# Patient Record
Sex: Female | Born: 1985 | Race: White | Hispanic: No | Smoking: Former smoker
Health system: Southern US, Community
[De-identification: ages and names within clinical notes are randomized; demographics above are authoritative.]

## PROBLEM LIST (undated history)

## (undated) ENCOUNTER — Inpatient Hospital Stay (HOSPITAL_COMMUNITY): Payer: Self-pay

## (undated) ENCOUNTER — Emergency Department (HOSPITAL_COMMUNITY): Payer: 59

## (undated) DIAGNOSIS — D649 Anemia, unspecified: Secondary | ICD-10-CM

## (undated) DIAGNOSIS — F329 Major depressive disorder, single episode, unspecified: Secondary | ICD-10-CM

## (undated) DIAGNOSIS — F419 Anxiety disorder, unspecified: Secondary | ICD-10-CM

## (undated) DIAGNOSIS — R51 Headache: Secondary | ICD-10-CM

## (undated) DIAGNOSIS — R519 Headache, unspecified: Secondary | ICD-10-CM

## (undated) DIAGNOSIS — J329 Chronic sinusitis, unspecified: Secondary | ICD-10-CM

## (undated) DIAGNOSIS — J45909 Unspecified asthma, uncomplicated: Secondary | ICD-10-CM

## (undated) DIAGNOSIS — J40 Bronchitis, not specified as acute or chronic: Secondary | ICD-10-CM

## (undated) DIAGNOSIS — J189 Pneumonia, unspecified organism: Secondary | ICD-10-CM

## (undated) DIAGNOSIS — F32A Depression, unspecified: Secondary | ICD-10-CM

## (undated) HISTORY — DX: Bronchitis, not specified as acute or chronic: J40

## (undated) HISTORY — DX: Chronic sinusitis, unspecified: J32.9

## (undated) HISTORY — DX: Pneumonia, unspecified organism: J18.9

## (undated) HISTORY — PX: APPENDECTOMY: SHX54

---

## 2015-04-23 LAB — OB RESULTS CONSOLE HEPATITIS B SURFACE ANTIGEN: Hepatitis B Surface Ag: NEGATIVE

## 2015-04-23 LAB — OB RESULTS CONSOLE RPR: RPR: NONREACTIVE

## 2015-04-23 LAB — OB RESULTS CONSOLE RUBELLA ANTIBODY, IGM: Rubella: IMMUNE

## 2015-04-23 LAB — OB RESULTS CONSOLE ABO/RH: RH Type: POSITIVE

## 2015-04-23 LAB — OB RESULTS CONSOLE HIV ANTIBODY (ROUTINE TESTING): HIV: NONREACTIVE

## 2015-04-23 LAB — OB RESULTS CONSOLE GC/CHLAMYDIA
Chlamydia: NEGATIVE
Gonorrhea: NEGATIVE

## 2015-04-23 LAB — OB RESULTS CONSOLE ANTIBODY SCREEN: Antibody Screen: NEGATIVE

## 2015-09-30 ENCOUNTER — Inpatient Hospital Stay (HOSPITAL_COMMUNITY)
Admission: AD | Admit: 2015-09-30 | Discharge: 2015-10-01 | Disposition: A | Discharge status: Home / Self Care | Payer: 59 | Source: Ambulatory Visit | Attending: Obstetrics and Gynecology | Admitting: Obstetrics and Gynecology

## 2015-09-30 ENCOUNTER — Encounter (HOSPITAL_COMMUNITY): Payer: Self-pay

## 2015-09-30 DIAGNOSIS — O26893 Other specified pregnancy related conditions, third trimester: Secondary | ICD-10-CM | POA: Insufficient documentation

## 2015-09-30 DIAGNOSIS — Z3A30 30 weeks gestation of pregnancy: Secondary | ICD-10-CM | POA: Insufficient documentation

## 2015-09-30 DIAGNOSIS — Z0371 Encounter for suspected problem with amniotic cavity and membrane ruled out: Secondary | ICD-10-CM

## 2015-09-30 DIAGNOSIS — N898 Other specified noninflammatory disorders of vagina: Secondary | ICD-10-CM | POA: Diagnosis not present

## 2015-09-30 LAB — WET PREP, GENITAL
Sperm: NONE SEEN
Trich, Wet Prep: NONE SEEN
Yeast Wet Prep HPF POC: NONE SEEN

## 2015-09-30 NOTE — MAU Note (Signed)
Pt presents complaining of possible SROM at 2130. Had a gush of clear fluid. Reports good fetal movement. Denies bleeding. Denies pain. Some tightening occasionally.

## 2015-09-30 NOTE — MAU Provider Note (Signed)
History     CSN: [id number]  Arrival date and time: 09/30/15 2226   First Provider Initiated Contact with Patient 09/30/15 2306      Chief Complaint  Patient presents with  . Vaginal Discharge   Madison [name] is a 30 y.o. G2P1001 at 8572w1d who presents today after a gush of fluid at around 2130. She states that she stood up after using the bathroom, and a small gush of fluid leaked out. She has not needed to wear a pad. She denies any HX of preterm birth or complications with this pregnancy or her previous pregnancy. Next appointment on 10/03/15.   Vaginal Discharge  The patient's primary symptoms include vaginal discharge. This is a new problem. The current episode started today. The problem occurs intermittently. The problem has been unchanged. The patient is experiencing no pain. Pertinent negatives include no abdominal pain, chills, constipation, diarrhea, dysuria, fever, frequency, nausea, urgency or vomiting. The vaginal discharge was watery. There has been no bleeding. Nothing aggravates the symptoms. She has tried nothing for the symptoms.     No past medical history on file.  No past surgical history on file.  No family history on file.  Social History  Substance Use Topics  . Smoking status: Not on file  . Smokeless tobacco: Not on file  . Alcohol use Not on file    Allergies: Allergies not on file  No prescriptions prior to admission.    Review of Systems  Constitutional: Negative for chills and fever.  Gastrointestinal: Negative for abdominal pain, constipation, diarrhea, nausea and vomiting.  Genitourinary: Positive for vaginal discharge. Negative for dysuria, frequency and urgency.   Physical Exam   Blood pressure 119/74, pulse 104, temperature 98.3 F (36.8 C), temperature source Oral, resp. rate 18.  Physical Exam  Nursing note and vitals reviewed. Constitutional: She is oriented to person, place, and time. She appears well-developed and  well-nourished. No distress.  HENT:  Head: Normocephalic.  Cardiovascular: Normal rate.   Respiratory: Effort normal.  GI: Soft. There is no tenderness. There is no rebound.  Genitourinary:  Genitourinary Comments:  External: no lesion Vagina: small amount of white discharge. NO pooling Cervix: pink, smooth, no fluid seen with valsalva closed/thick  Uterus: AGA  Neurological: She is alert and oriented to person, place, and time.  Skin: Skin is warm and dry.  Psychiatric: She has a normal mood and affect.    FHT: 145, moderate with 15x15 accels, no decels Toco: no UCs   Results for orders placed or performed during the hospital encounter of 09/30/15 (from the past 24 hour(s))  Wet prep, genital     Status: Abnormal   Collection Time: 09/30/15 11:15 PM  Result Value Ref Range   Yeast Wet Prep HPF POC NONE SEEN NONE SEEN   Trich, Wet Prep NONE SEEN NONE SEEN   Clue Cells Wet Prep HPF POC PRESENT (A) NONE SEEN   WBC, Wet Prep HPF POC MANY (A) NONE SEEN   Sperm NONE SEEN     MAU Course  Procedures  MDM 2358: D/W Dr. Renaldo FiddlerAdkins, ok for DC home.   Assessment and Plan   1. Vaginal discharge in pregnancy in third trimester   2. Encounter for suspected premature rupture of membranes, with rupture of membranes not found   3. [redacted] weeks gestation of pregnancy    DC home Comfort measures reviewed  3rd Trimester precautions  PTL precautions  Fetal kick counts RX: none  Return to MAU as needed FU  with OB as planned  Follow-up Information    ADKINS,GRETCHEN, MD .   Specialty:  Obstetrics and Gynecology Contact information: [address], [address] [place] [id number] [phone]           Tawnya CrookHogan, Heather Donovan 09/30/2015, 11:09 PM

## 2015-10-01 DIAGNOSIS — O26893 Other specified pregnancy related conditions, third trimester: Secondary | ICD-10-CM | POA: Diagnosis not present

## 2015-10-01 DIAGNOSIS — N898 Other specified noninflammatory disorders of vagina: Secondary | ICD-10-CM | POA: Diagnosis not present

## 2015-10-01 NOTE — Discharge Instructions (Signed)

## 2015-11-03 ENCOUNTER — Inpatient Hospital Stay (HOSPITAL_COMMUNITY)
Admission: AD | Admit: 2015-11-03 | Discharge: 2015-11-03 | Disposition: A | Discharge status: Home / Self Care | Payer: 59 | Source: Ambulatory Visit | Attending: Obstetrics and Gynecology | Admitting: Obstetrics and Gynecology

## 2015-11-03 ENCOUNTER — Encounter (HOSPITAL_COMMUNITY): Payer: Self-pay

## 2015-11-03 DIAGNOSIS — O4703 False labor before 37 completed weeks of gestation, third trimester: Secondary | ICD-10-CM | POA: Diagnosis not present

## 2015-11-03 DIAGNOSIS — Z3A35 35 weeks gestation of pregnancy: Secondary | ICD-10-CM | POA: Insufficient documentation

## 2015-11-03 DIAGNOSIS — Z87891 Personal history of nicotine dependence: Secondary | ICD-10-CM | POA: Insufficient documentation

## 2015-11-03 HISTORY — DX: Anemia, unspecified: D64.9

## 2015-11-03 LAB — URINALYSIS, ROUTINE W REFLEX MICROSCOPIC
Bilirubin Urine: NEGATIVE
Glucose, UA: NEGATIVE mg/dL
Ketones, ur: NEGATIVE mg/dL
Leukocytes, UA: NEGATIVE
Nitrite: NEGATIVE
Protein, ur: 30 mg/dL — AB
Specific Gravity, Urine: 1.02 (ref 1.005–1.030)
pH: 6.5 (ref 5.0–8.0)

## 2015-11-03 LAB — WET PREP, GENITAL
Clue Cells Wet Prep HPF POC: NONE SEEN
Sperm: NONE SEEN
Trich, Wet Prep: NONE SEEN
Yeast Wet Prep HPF POC: NONE SEEN

## 2015-11-03 LAB — URINE MICROSCOPIC-ADD ON

## 2015-11-03 LAB — CBC WITH DIFFERENTIAL/PLATELET
Basophils Absolute: 0 10*3/uL (ref 0.0–0.1)
Basophils Relative: 0 %
Eosinophils Absolute: 0.1 10*3/uL (ref 0.0–0.7)
Eosinophils Relative: 1 %
HCT: 28.7 % — ABNORMAL LOW (ref 36.0–46.0)
Hemoglobin: 9.6 g/dL — ABNORMAL LOW (ref 12.0–15.0)
Lymphocytes Relative: 22 %
Lymphs Abs: 2 10*3/uL (ref 0.7–4.0)
MCH: 29.9 pg (ref 26.0–34.0)
MCHC: 33.4 g/dL (ref 30.0–36.0)
MCV: 89.4 fL (ref 78.0–100.0)
Monocytes Absolute: 0.5 10*3/uL (ref 0.1–1.0)
Monocytes Relative: 5 %
Neutro Abs: 6.4 10*3/uL (ref 1.7–7.7)
Neutrophils Relative %: 72 %
Platelets: 220 10*3/uL (ref 150–400)
RBC: 3.21 MIL/uL — ABNORMAL LOW (ref 3.87–5.11)
RDW: 14 % (ref 11.5–15.5)
WBC: 9 10*3/uL (ref 4.0–10.5)

## 2015-11-03 LAB — GROUP B STREP BY PCR: Group B strep by PCR: NEGATIVE

## 2015-11-03 MED ORDER — ONDANSETRON 4 MG PO TBDP: 4.0000 mg | ORAL_TABLET | Freq: Four times a day (QID) | ORAL | 0 refills | Status: DC | PRN

## 2015-11-03 MED ORDER — BETAMETHASONE SOD PHOS & ACET 6 (3-3) MG/ML IJ SUSP
12.0000 mg | INTRAMUSCULAR | Status: AC
  Administered 2015-11-03: 12 mg via INTRAMUSCULAR
  Filled 2015-11-03: qty 2

## 2015-11-03 MED ORDER — ONDANSETRON HCL 4 MG/2ML IJ SOLN
4.0000 mg | Freq: Four times a day (QID) | INTRAMUSCULAR | Status: DC | PRN
Start: 2015-11-03 — End: 2015-11-03
  Administered 2015-11-03: 4 mg via INTRAVENOUS
  Filled 2015-11-03: qty 2

## 2015-11-03 MED ORDER — FENTANYL CITRATE (PF) 100 MCG/2ML IJ SOLN
50.0000 ug | INTRAMUSCULAR | Status: DC | PRN
  Administered 2015-11-03: 100 ug via INTRAVENOUS
  Administered 2015-11-03: 50 ug via INTRAVENOUS
  Filled 2015-11-03 (×2): qty 2

## 2015-11-03 MED ORDER — OXYCODONE-ACETAMINOPHEN 5-325 MG PO TABS: 1.0000 | ORAL_TABLET | Freq: Four times a day (QID) | ORAL | 0 refills | Status: DC | PRN

## 2015-11-03 MED ORDER — PROMETHAZINE HCL 25 MG PO TABS: 25.0000 mg | ORAL_TABLET | Freq: Four times a day (QID) | ORAL | 2 refills | Status: DC | PRN

## 2015-11-03 MED ORDER — LACTATED RINGERS IV BOLUS (SEPSIS)
1000.0000 mL | Freq: Once | INTRAVENOUS | Status: AC
  Administered 2015-11-03: 1000 mL via INTRAVENOUS

## 2015-11-03 NOTE — Discharge Instructions (Signed)
Preterm Labor Information °Preterm labor is when labor starts at less than 37 weeks of pregnancy. The normal length of a pregnancy is 39 to 41 weeks. °CAUSES °Often, there is no identifiable underlying cause as to why a woman goes into preterm labor. One of the most common known causes of preterm labor is infection. Infections of the uterus, cervix, vagina, amniotic sac, bladder, kidney, or even the lungs (pneumonia) can cause labor to start. Other suspected causes of preterm labor include:  °· Urogenital infections, such as yeast infections and bacterial vaginosis.   °· Uterine abnormalities (uterine shape, uterine septum, fibroids, or bleeding from the placenta).   °· A cervix that has been operated on (it may fail to stay closed).   °· Malformations in the fetus.   °· Multiple gestations (twins, triplets, and so on).   °· Breakage of the amniotic sac.   °RISK FACTORS °· Having a previous history of preterm labor.   °· Having premature rupture of membranes (PROM).   °· Having a placenta that covers the opening of the cervix (placenta previa).   °· Having a placenta that separates from the uterus (placental abruption).   °· Having a cervix that is too weak to hold the fetus in the uterus (incompetent cervix).   °· Having too much fluid in the amniotic sac (polyhydramnios).   °· Taking illegal drugs or smoking while pregnant.   °· Not gaining enough weight while pregnant.   °· Being younger than 18 and older than 31 years old.   °· Having a low socioeconomic status.   °· Being African American. °SYMPTOMS °Signs and symptoms of preterm labor include:  °· Menstrual-like cramps, abdominal pain, or back pain. °· Uterine contractions that are regular, as frequent as six in an hour, regardless of their intensity (may be mild or painful). °· Contractions that start on the top of the uterus and spread down to the lower abdomen and back.   °· A sense of increased pelvic pressure.   °· A watery or bloody mucus discharge that  comes from the vagina.   °TREATMENT °Depending on the length of the pregnancy and other circumstances, your health care provider may suggest bed rest. If necessary, there are medicines that can be given to stop contractions and to mature the fetal lungs. If labor happens before 34 weeks of pregnancy, a prolonged hospital stay may be recommended. Treatment depends on the condition of both you and the fetus.  °WHAT SHOULD YOU DO IF YOU THINK YOU ARE IN PRETERM LABOR? °Call your health care provider right away. You will need to go to the hospital to get checked immediately. °HOW CAN YOU PREVENT PRETERM LABOR IN FUTURE PREGNANCIES? °You should:  °· Stop smoking if you smoke.  °· Maintain healthy weight gain and avoid chemicals and drugs that are not necessary. °· Be watchful for any type of infection. °· Inform your health care provider if you have a known history of preterm labor. °  °This information is not intended to replace advice given to you by your health care provider. Make sure you discuss any questions you have with your health care provider. °  °Document Released: 04/24/2003 Document Revised: 10/04/2012 Document Reviewed: 03/06/2012 °Elsevier Interactive Patient Education ©2016 [employer] ° ° °Braxton Hicks Contractions °Contractions of the uterus can occur throughout pregnancy. Contractions are not always a sign that you are in labor.  °WHAT ARE BRAXTON HICKS CONTRACTIONS?  °Contractions that occur before labor are called Braxton Hicks contractions, or false labor. Toward the end of pregnancy (32-34 weeks), these contractions can develop more often and may become   more forceful. This is not true labor because these contractions do not result in opening (dilatation) and thinning of the cervix. They are sometimes difficult to tell apart from true labor because these contractions can be forceful and people have different pain tolerances. You should not feel embarrassed if you go to the hospital with false  labor. Sometimes, the only way to tell if you are in true labor is for your health care provider to look for changes in the cervix. °If there are no prenatal problems or other health problems associated with the pregnancy, it is completely safe to be sent home with false labor and await the onset of true labor. °HOW CAN YOU TELL THE DIFFERENCE BETWEEN TRUE AND FALSE LABOR? °False Labor °· The contractions of false labor are usually shorter and not as hard as those of true labor.   °· The contractions are usually irregular.   °· The contractions are often felt in the front of the lower abdomen and in the groin.   °· The contractions may go away when you walk around or change positions while lying down.   °· The contractions get weaker and are shorter lasting as time goes on.   °· The contractions do not usually become progressively stronger, regular, and closer together as with true labor.   °True Labor °· Contractions in true labor last 30-70 seconds, become very regular, usually become more intense, and increase in frequency.   °· The contractions do not go away with walking.   °· The discomfort is usually felt in the top of the uterus and spreads to the lower abdomen and low back.   °· True labor can be determined by your health care provider with an exam. This will show that the cervix is dilating and getting thinner.   °WHAT TO REMEMBER °· Keep up with your usual exercises and follow other instructions given by your health care provider.   °· Take medicines as directed by your health care provider.   °· Keep your regular prenatal appointments.   °· Eat and drink lightly if you think you are going into labor.   °· If Braxton Hicks contractions are making you uncomfortable:   °¨ Change your position from lying down or resting to walking, or from walking to resting.   °¨ Sit and rest in a tub of warm water.   °¨ Drink 2-3 glasses of water. Dehydration may cause these contractions.   °¨ Do slow and deep breathing  several times an hour.   °WHEN SHOULD I SEEK IMMEDIATE MEDICAL CARE? °Seek immediate medical care if: °· Your contractions become stronger, more regular, and closer together.   °· You have fluid leaking or gushing from your vagina.   °· You have a fever.   °· You pass blood-tinged mucus.   °· You have vaginal bleeding.   °· You have continuous abdominal pain.   °· You have low back pain that you never had before.   °· You feel your baby's head pushing down and causing pelvic pressure.   °· Your baby is not moving as much as it used to.   °  °This information is not intended to replace advice given to you by your health care provider. Make sure you discuss any questions you have with your health care provider. °  °Document Released: 02/01/2005 Document Revised: 02/06/2013 Document Reviewed: 11/13/2012 °Elsevier Interactive Patient Education ©2016 Elsevier Inc. ° °

## 2015-11-03 NOTE — MAU Provider Note (Signed)
History     CSN: [id number]  Arrival date and time: 11/03/15 4098   First Provider Initiated Contact with Patient 11/03/15 1018      Chief Complaint  Patient presents with  . Contractions   HPI 30 y.o. G2P1001 at [redacted]w[redacted]d followed at [facility], presents today with report of frequent painful contractions since this morning.  Denies LOF, VB.  Good FM currently. History remarkable for previous term SVD complicated by shoulder dystocia and 4th degree laceration, patient is scheduled for cesarean section at 39 weeks. Today, she says she is willing to undergo vaginal delivery if preterm labor progresses as this baby is small, and was measuring two weeks behind at her prenatal visits.    Obstetric History   G2   P1   T1   P0   A0   L1    SAB0   TAB0   Ectopic0   Multiple0   Live Births1     # Outcome Date GA Lbr Len/2nd Weight Sex Delivery Anes PTL Lv  2 Current           1 Term      Vag-Spont   LIV      Past Medical History:  Diagnosis Date  . Anemia     History reviewed. No pertinent surgical history.  History reviewed. No pertinent family history.  Social History  Substance Use Topics  . Smoking status: Former Smoker    Types: Cigarettes    Quit date: 04/08/2015  . Smokeless tobacco: Never Used  . Alcohol use No    Allergies:  Allergies  Allergen Reactions  . Codeine Nausea And Vomiting  . Hydrocodone Nausea And Vomiting    Prescriptions Prior to Admission  Medication Sig Dispense Refill Last Dose  . docusate sodium (COLACE) 100 MG capsule Take 100 mg by mouth 2 (two) times daily.   11/02/2015 at Unknown time  . famotidine (PEPCID AC) 10 MG chewable tablet Chew 10 mg by mouth 2 (two) times daily.   11/02/2015 at Unknown time  . Prenatal Vit-Fe Fumarate-FA (PRENATAL MULTIVITAMIN) TABS tablet Take 1 tablet by mouth daily at 12 noon.   11/02/2015 at Unknown time    Review of Systems  Constitutional: Negative for chills and fever.  Gastrointestinal: Negative  for abdominal pain, diarrhea, nausea and vomiting.  Genitourinary: Negative for dysuria, frequency, hematuria and urgency.  Neurological: Negative for headaches.   Physical Exam   Blood pressure 121/78, pulse 92, temperature 98.8 F (37.1 C), resp. rate 20. Category 1 FHR tracing Contractions q2-3 minutes  Physical Exam  Constitutional: She is oriented to person, place, and time. She appears well-developed. She appears distressed.  Distressed due to contraction pain  HENT:  Head: Normocephalic and atraumatic.  Neck: Normal range of motion. Neck supple.  Cardiovascular: Normal rate and regular rhythm.   Respiratory: Effort normal and breath sounds normal.  GI: Soft. She exhibits no distension. There is no tenderness.  Genitourinary: Vagina normal. No vaginal discharge found.  Musculoskeletal: Normal range of motion.  Neurological: She is alert and oriented to person, place, and time.  Skin: Skin is warm and dry.  Cervix: Dilation: 1 Effacement (%): 50 Cervical Position: Posterior Station: -3 Presentation: Vertex Exam by:: Dr. Macon Large  MAU Course  Procedures  MDM 1015: Exam done, cultures including rapid GBS done. Other labs ordered. Fentanyl ordered for pain. LR 1L bolus ordered. Betamethasone ordered given preterm status.  1030: Patient had episodes of emesis after IV placement (had multiple attempts).  Zofran ordered.  1145: Unchanged cervical exam. Patient still in a lot of pain.  Second dose of Fentanyl 100 mg ordered. Called Dr. Rana Snare, the Attending on call and discussed patient with him. He agrees with plan for further observation for about one hour and pain medication. If no change, will send home with precaution sand follow up in office tomorrow for evaluation and possible second betamethasone. 1343 Unchanged exam. Will discharge to home. PTL and FM precautions advised.   Results for orders placed or performed during the hospital encounter of 11/03/15 (from the past 24  hour(s))  Group B strep by PCR     Status: None   Collection Time: 11/03/15 10:15 AM  Result Value Ref Range   Group B strep by PCR NEGATIVE NEGATIVE  Wet prep, genital     Status: Abnormal   Collection Time: 11/03/15 10:15 AM  Result Value Ref Range   Yeast Wet Prep HPF POC NONE SEEN NONE SEEN   Trich, Wet Prep NONE SEEN NONE SEEN   Clue Cells Wet Prep HPF POC NONE SEEN NONE SEEN   WBC, Wet Prep HPF POC FEW (A) NONE SEEN   Sperm NONE SEEN   Urinalysis, Routine w reflex microscopic (not at [facility])     Status: Abnormal   Collection Time: 11/03/15 10:17 AM  Result Value Ref Range   Color, Urine YELLOW YELLOW   APPearance CLOUDY (A) CLEAR   Specific Gravity, Urine 1.020 1.005 - 1.030   pH 6.5 5.0 - 8.0   Glucose, UA NEGATIVE NEGATIVE mg/dL   Hgb urine dipstick LARGE (A) NEGATIVE   Bilirubin Urine NEGATIVE NEGATIVE   Ketones, ur NEGATIVE NEGATIVE mg/dL   Protein, ur 30 (A) NEGATIVE mg/dL   Nitrite NEGATIVE NEGATIVE   Leukocytes, UA NEGATIVE NEGATIVE  Urine microscopic-add on     Status: Abnormal   Collection Time: 11/03/15 10:17 AM  Result Value Ref Range   Squamous Epithelial / LPF 0-5 (A) NONE SEEN   WBC, UA 0-5 0 - 5 WBC/hpf   RBC / HPF TOO NUMEROUS TO COUNT 0 - 5 RBC/hpf   Bacteria, UA FEW (A) NONE SEEN   Urine-Other MUCOUS PRESENT   CBC with Differential/Platelet     Status: Abnormal   Collection Time: 11/03/15 10:40 AM  Result Value Ref Range   WBC 9.0 4.0 - 10.5 K/uL   RBC 3.21 (L) 3.87 - 5.11 MIL/uL   Hemoglobin 9.6 (L) 12.0 - 15.0 g/dL   HCT 24.4 (L) 01.0 - 27.2 %   MCV 89.4 78.0 - 100.0 fL   MCH 29.9 26.0 - 34.0 pg   MCHC 33.4 30.0 - 36.0 g/dL   RDW 53.6 64.4 - 03.4 %   Platelets 220 150 - 400 K/uL   Neutrophils Relative % 72 %   Neutro Abs 6.4 1.7 - 7.7 K/uL   Lymphocytes Relative 22 %   Lymphs Abs 2.0 0.7 - 4.0 K/uL   Monocytes Relative 5 %   Monocytes Absolute 0.5 0.1 - 1.0 K/uL   Eosinophils Relative 1 %   Eosinophils Absolute 0.1 0.0 - 0.7 K/uL    Basophils Relative 0 %   Basophils Absolute 0.0 0.0 - 0.1 K/uL    Assessment and Plan  Threatened preterm labor. No cervical change in over 3 hours of observation.  She received one dose of betamethasone, will be reevaluate in office tomorrow and may receive 2nd dose then. PTL and FM precautions reviewed with patient. Percocet given prn pain. Phenergan and Zofran  given as needed for nausea. Discharged to home in stable condition.  Tereso NewcomerANYANWU,UGONNA A, MD 11/03/2015, 1:44 PM

## 2015-11-03 NOTE — MAU Note (Signed)
Pain/contractions started around 0815. Has been getting worse since and closer together

## 2015-11-04 ENCOUNTER — Inpatient Hospital Stay (HOSPITAL_COMMUNITY)
Admission: AD | Admit: 2015-11-04 | Discharge: 2015-11-04 | Disposition: A | Discharge status: Home / Self Care | Payer: 59 | Source: Ambulatory Visit | Attending: Obstetrics and Gynecology | Admitting: Obstetrics and Gynecology

## 2015-11-04 DIAGNOSIS — Z3A35 35 weeks gestation of pregnancy: Secondary | ICD-10-CM | POA: Diagnosis not present

## 2015-11-04 LAB — GC/CHLAMYDIA PROBE AMP (~~LOC~~) NOT AT ARMC
Chlamydia: NEGATIVE
Neisseria Gonorrhea: NEGATIVE

## 2015-11-04 LAB — RPR: RPR Ser Ql: NONREACTIVE

## 2015-11-04 MED ORDER — BETAMETHASONE SOD PHOS & ACET 6 (3-3) MG/ML IJ SUSP
12.0000 mg | Freq: Once | INTRAMUSCULAR | Status: AC
  Administered 2015-11-04: 12 mg via INTRAMUSCULAR
  Filled 2015-11-04: qty 2

## 2015-11-25 ENCOUNTER — Telehealth (HOSPITAL_COMMUNITY): Payer: Self-pay | Admitting: *Deleted

## 2015-11-25 ENCOUNTER — Encounter (HOSPITAL_COMMUNITY): Payer: Self-pay

## 2015-11-25 LAB — OB RESULTS CONSOLE GBS: GBS: NEGATIVE

## 2015-11-25 NOTE — Telephone Encounter (Signed)
Preadmission screen  

## 2015-11-26 ENCOUNTER — Encounter (HOSPITAL_COMMUNITY): Payer: Self-pay

## 2015-12-02 ENCOUNTER — Encounter (HOSPITAL_COMMUNITY)
Admission: RE | Admit: 2015-12-02 | Discharge: 2015-12-02 | Disposition: A | Discharge status: Home / Self Care | Payer: 59 | Source: Ambulatory Visit | Attending: Obstetrics and Gynecology | Admitting: Obstetrics and Gynecology

## 2015-12-02 ENCOUNTER — Encounter (HOSPITAL_COMMUNITY): Payer: Self-pay | Admitting: Anesthesiology

## 2015-12-02 HISTORY — DX: Anxiety disorder, unspecified: F41.9

## 2015-12-02 HISTORY — DX: Headache, unspecified: R51.9

## 2015-12-02 HISTORY — DX: Depression, unspecified: F32.A

## 2015-12-02 HISTORY — DX: Headache: R51

## 2015-12-02 HISTORY — DX: Major depressive disorder, single episode, unspecified: F32.9

## 2015-12-02 HISTORY — DX: Unspecified asthma, uncomplicated: J45.909

## 2015-12-02 LAB — CBC
HCT: 29.6 % — ABNORMAL LOW (ref 36.0–46.0)
Hemoglobin: 9.7 g/dL — ABNORMAL LOW (ref 12.0–15.0)
MCH: 30.1 pg (ref 26.0–34.0)
MCHC: 32.8 g/dL (ref 30.0–36.0)
MCV: 91.9 fL (ref 78.0–100.0)
Platelets: 258 10*3/uL (ref 150–400)
RBC: 3.22 MIL/uL — ABNORMAL LOW (ref 3.87–5.11)
RDW: 15.1 % (ref 11.5–15.5)
WBC: 8.6 10*3/uL (ref 4.0–10.5)

## 2015-12-02 LAB — TYPE AND SCREEN
ABO/RH(D): O POS
Antibody Screen: NEGATIVE

## 2015-12-02 LAB — ABO/RH: ABO/RH(D): O POS

## 2015-12-02 MED ORDER — DEXTROSE 5 % IV SOLN
2.0000 g | INTRAVENOUS | Status: AC
  Administered 2015-12-03: 2 g via INTRAVENOUS
  Filled 2015-12-02: qty 2

## 2015-12-02 NOTE — Anesthesia Preprocedure Evaluation (Addendum)
Anesthesia Evaluation  Patient identified by MRN, date of birth, ID band Patient awake    Reviewed: Allergy & Precautions, NPO status , Patient's Chart, lab work & pertinent test results  Airway Mallampati: I       Dental no notable dental hx.    Pulmonary former smoker,    Pulmonary exam normal        Cardiovascular negative cardio ROS Normal cardiovascular exam     Neuro/Psych    GI/Hepatic negative GI ROS, Neg liver ROS,   Endo/Other  negative endocrine ROS  Renal/GU negative Renal ROS  negative genitourinary   Musculoskeletal negative musculoskeletal ROS (+)   Abdominal Normal abdominal exam  (+)   Peds negative pediatric ROS (+)  Hematology   Anesthesia Other Findings   Reproductive/Obstetrics negative OB ROS                            Anesthesia Physical Anesthesia Plan  ASA: II  Anesthesia Plan: Spinal   Post-op Pain Management:    Induction:   Airway Management Planned:   Additional Equipment:   Intra-op Plan:   Post-operative Plan:   Informed Consent: I have reviewed the patients History and Physical, chart, labs and discussed the procedure including the risks, benefits and alternatives for the proposed anesthesia with the patient or authorized representative who has indicated his/her understanding and acceptance.     Plan Discussed with: CRNA and Surgeon  Anesthesia Plan Comments:        Anesthesia Quick Evaluation

## 2015-12-02 NOTE — Patient Instructions (Signed)
20 Georgette ShellRebecca Partin  12/02/2015   Your procedure is scheduled on:  12/03/2015  [facility] 0[facility] AM.  Pick up the phone at the desk and dial [phone].   Call this number if you have problems the morning of surgery: [phone]   Remember:   Do not eat food:After Midnight.  Do not drink clear liquids: After Midnight.  Take these medicines the morning of surgery with A SIP OF WATER: zantac   Do not wear jewelry, make-up or nail polish.  Do not wear lotions, powders, or perfumes. Do not wear deodorant.  Do not shave 48 hours prior to surgery.  Do not bring valuables to the hospital.  [facility] is not   responsible for any belongings or valuables brought to the hospital.  Contacts, dentures or bridgework may not be worn into surgery.  Leave suitcase in the car. After surgery it may be brought to your room.  For patients admitted to the hospital, checkout time is 11:00 AM the day of              discharge.   Patients discharged the day of surgery will not be allowed to drive             home.  Name and phone number of your driver: na  Special Instructions:   N/A   Please read over the following fact sheets that you were given:   Surgical Site Infection Prevention

## 2015-12-03 ENCOUNTER — Inpatient Hospital Stay (HOSPITAL_COMMUNITY): Payer: 59 | Admitting: Anesthesiology

## 2015-12-03 ENCOUNTER — Encounter (HOSPITAL_COMMUNITY): Payer: Self-pay

## 2015-12-03 ENCOUNTER — Inpatient Hospital Stay (HOSPITAL_COMMUNITY)
Admission: RE | Admit: 2015-12-03 | Discharge: 2015-12-07 | DRG: 765 | Disposition: A | Discharge status: Home / Self Care | Payer: 59 | Source: Ambulatory Visit | Attending: Obstetrics and Gynecology | Admitting: Obstetrics and Gynecology

## 2015-12-03 ENCOUNTER — Encounter (HOSPITAL_COMMUNITY)
Admission: RE | Disposition: A | Discharge status: Home / Self Care | Payer: Self-pay | Source: Ambulatory Visit | Attending: Obstetrics and Gynecology

## 2015-12-03 DIAGNOSIS — K567 Ileus, unspecified: Secondary | ICD-10-CM | POA: Diagnosis present

## 2015-12-03 DIAGNOSIS — Z8249 Family history of ischemic heart disease and other diseases of the circulatory system: Secondary | ICD-10-CM

## 2015-12-03 DIAGNOSIS — Z833 Family history of diabetes mellitus: Secondary | ICD-10-CM

## 2015-12-03 DIAGNOSIS — Z3A39 39 weeks gestation of pregnancy: Secondary | ICD-10-CM

## 2015-12-03 DIAGNOSIS — Z3490 Encounter for supervision of normal pregnancy, unspecified, unspecified trimester: Secondary | ICD-10-CM

## 2015-12-03 DIAGNOSIS — K9189 Other postprocedural complications and disorders of digestive system: Secondary | ICD-10-CM | POA: Diagnosis present

## 2015-12-03 DIAGNOSIS — Z87891 Personal history of nicotine dependence: Secondary | ICD-10-CM | POA: Diagnosis not present

## 2015-12-03 DIAGNOSIS — O26893 Other specified pregnancy related conditions, third trimester: Principal | ICD-10-CM | POA: Diagnosis present

## 2015-12-03 DIAGNOSIS — Z823 Family history of stroke: Secondary | ICD-10-CM | POA: Diagnosis not present

## 2015-12-03 LAB — RPR: RPR Ser Ql: NONREACTIVE

## 2015-12-03 SURGERY — Surgical Case
Anesthesia: Spinal

## 2015-12-03 MED ORDER — KETOROLAC TROMETHAMINE 30 MG/ML IJ SOLN
INTRAMUSCULAR | Status: AC
  Administered 2015-12-03: 30 mg via INTRAMUSCULAR
  Filled 2015-12-03: qty 1

## 2015-12-03 MED ORDER — SCOPOLAMINE 1 MG/3DAYS TD PT72
1.0000 | MEDICATED_PATCH | Freq: Once | TRANSDERMAL | Status: DC
  Administered 2015-12-03: 1.5 mg via TRANSDERMAL

## 2015-12-03 MED ORDER — SIMETHICONE 80 MG PO CHEW
80.0000 mg | CHEWABLE_TABLET | ORAL | Status: DC
  Administered 2015-12-03 – 2015-12-06 (×4): 80 mg via ORAL
  Filled 2015-12-03 (×4): qty 1

## 2015-12-03 MED ORDER — PHENYLEPHRINE 8 MG IN D5W 100 ML (0.08MG/ML) PREMIX OPTIME
INJECTION | INTRAVENOUS | Status: AC
  Filled 2015-12-03: qty 100

## 2015-12-03 MED ORDER — EPHEDRINE SULFATE 50 MG/ML IJ SOLN
INTRAMUSCULAR | Status: DC | PRN
  Administered 2015-12-03: 10 mg via INTRAVENOUS

## 2015-12-03 MED ORDER — ONDANSETRON HCL 4 MG/2ML IJ SOLN: 4.0000 mg | Freq: Three times a day (TID) | INTRAMUSCULAR | Status: DC | PRN

## 2015-12-03 MED ORDER — FENTANYL CITRATE (PF) 100 MCG/2ML IJ SOLN
INTRAMUSCULAR | Status: AC
  Filled 2015-12-03: qty 2

## 2015-12-03 MED ORDER — SODIUM CHLORIDE 0.9 % IR SOLN
Status: DC | PRN
  Administered 2015-12-03: 1000 mL

## 2015-12-03 MED ORDER — MORPHINE SULFATE-NACL 0.5-0.9 MG/ML-% IV SOSY
PREFILLED_SYRINGE | INTRAVENOUS | Status: AC
  Filled 2015-12-03: qty 1

## 2015-12-03 MED ORDER — IBUPROFEN 600 MG PO TABS
600.0000 mg | ORAL_TABLET | Freq: Four times a day (QID) | ORAL | Status: DC
  Administered 2015-12-03 – 2015-12-07 (×15): 600 mg via ORAL
  Filled 2015-12-03 (×15): qty 1

## 2015-12-03 MED ORDER — PROMETHAZINE HCL 25 MG/ML IJ SOLN: 6.2500 mg | INTRAMUSCULAR | Status: DC | PRN

## 2015-12-03 MED ORDER — OXYTOCIN 40 UNITS IN LACTATED RINGERS INFUSION - SIMPLE MED: 2.5000 [IU]/h | INTRAVENOUS | Status: AC

## 2015-12-03 MED ORDER — ONDANSETRON HCL 4 MG/2ML IJ SOLN
INTRAMUSCULAR | Status: AC
Start: 2015-12-03 — End: 2015-12-03
  Filled 2015-12-03: qty 2

## 2015-12-03 MED ORDER — PHENYLEPHRINE 8 MG IN D5W 100 ML (0.08MG/ML) PREMIX OPTIME
INJECTION | INTRAVENOUS | Status: DC | PRN
  Administered 2015-12-03: 60 ug/min via INTRAVENOUS

## 2015-12-03 MED ORDER — KETOROLAC TROMETHAMINE 30 MG/ML IJ SOLN: 30.0000 mg | Freq: Four times a day (QID) | INTRAMUSCULAR | Status: DC | PRN

## 2015-12-03 MED ORDER — COCONUT OIL OIL
1.0000 | TOPICAL_OIL | Status: DC | PRN
Start: 2015-12-03 — End: 2015-12-07
  Administered 2015-12-06: 1 via TOPICAL
  Filled 2015-12-03: qty 120

## 2015-12-03 MED ORDER — KETOROLAC TROMETHAMINE 30 MG/ML IJ SOLN: 30.0000 mg | Freq: Once | INTRAMUSCULAR | Status: DC

## 2015-12-03 MED ORDER — SCOPOLAMINE 1 MG/3DAYS TD PT72
MEDICATED_PATCH | TRANSDERMAL | Status: AC
Start: 2015-12-03 — End: 2015-12-06
  Administered 2015-12-03: 1.5 mg via TRANSDERMAL
  Filled 2015-12-03: qty 1

## 2015-12-03 MED ORDER — LACTATED RINGERS IV SOLN
INTRAVENOUS | Status: DC
  Administered 2015-12-03: 15:00:00 via INTRAVENOUS

## 2015-12-03 MED ORDER — PRENATAL MULTIVITAMIN CH
1.0000 | ORAL_TABLET | Freq: Every day | ORAL | Status: DC
  Administered 2015-12-04 – 2015-12-07 (×3): 1 via ORAL
  Filled 2015-12-03 (×3): qty 1

## 2015-12-03 MED ORDER — WITCH HAZEL-GLYCERIN EX PADS: 1.0000 "application " | MEDICATED_PAD | CUTANEOUS | Status: DC | PRN

## 2015-12-03 MED ORDER — ZOLPIDEM TARTRATE 5 MG PO TABS: 5.0000 mg | ORAL_TABLET | Freq: Every evening | ORAL | Status: DC | PRN

## 2015-12-03 MED ORDER — OXYTOCIN 10 UNIT/ML IJ SOLN
INTRAMUSCULAR | Status: AC
  Filled 2015-12-03: qty 4

## 2015-12-03 MED ORDER — ACETAMINOPHEN 500 MG PO TABS
1000.0000 mg | ORAL_TABLET | Freq: Four times a day (QID) | ORAL | Status: AC
  Administered 2015-12-03 – 2015-12-04 (×4): 1000 mg via ORAL
  Filled 2015-12-03 (×4): qty 2

## 2015-12-03 MED ORDER — TETANUS-DIPHTH-ACELL PERTUSSIS 5-2.5-18.5 LF-MCG/0.5 IM SUSP: 0.5000 mL | Freq: Once | INTRAMUSCULAR | Status: DC

## 2015-12-03 MED ORDER — NALBUPHINE HCL 10 MG/ML IJ SOLN: 5.0000 mg | Freq: Once | INTRAMUSCULAR | Status: DC | PRN

## 2015-12-03 MED ORDER — NALBUPHINE HCL 10 MG/ML IJ SOLN: 5.0000 mg | INTRAMUSCULAR | Status: DC | PRN

## 2015-12-03 MED ORDER — SIMETHICONE 80 MG PO CHEW: 80.0000 mg | CHEWABLE_TABLET | ORAL | Status: DC | PRN

## 2015-12-03 MED ORDER — VITAMIN K1 1 MG/0.5ML IJ SOLN
INTRAMUSCULAR | Status: AC
  Filled 2015-12-03: qty 0.5

## 2015-12-03 MED ORDER — ACETAMINOPHEN 325 MG PO TABS: 650.0000 mg | ORAL_TABLET | ORAL | Status: DC | PRN

## 2015-12-03 MED ORDER — SODIUM CHLORIDE 0.9% FLUSH: 3.0000 mL | INTRAVENOUS | Status: DC | PRN

## 2015-12-03 MED ORDER — DIPHENHYDRAMINE HCL 50 MG/ML IJ SOLN: 12.5000 mg | INTRAMUSCULAR | Status: DC | PRN

## 2015-12-03 MED ORDER — BUPIVACAINE IN DEXTROSE 0.75-8.25 % IT SOLN
INTRATHECAL | Status: DC | PRN
  Administered 2015-12-03: 1.4 mL via INTRATHECAL

## 2015-12-03 MED ORDER — OXYTOCIN 10 UNIT/ML IJ SOLN
INTRAVENOUS | Status: DC | PRN
  Administered 2015-12-03: 40 [IU] via INTRAVENOUS

## 2015-12-03 MED ORDER — MEPERIDINE HCL 25 MG/ML IJ SOLN: 6.2500 mg | INTRAMUSCULAR | Status: DC | PRN

## 2015-12-03 MED ORDER — IBUPROFEN 600 MG PO TABS: 600.0000 mg | ORAL_TABLET | Freq: Four times a day (QID) | ORAL | Status: DC | PRN

## 2015-12-03 MED ORDER — FENTANYL CITRATE (PF) 100 MCG/2ML IJ SOLN
25.0000 ug | INTRAMUSCULAR | Status: DC | PRN
  Administered 2015-12-03: 50 ug via INTRAVENOUS
  Administered 2015-12-03 (×2): 25 ug via INTRAVENOUS

## 2015-12-03 MED ORDER — DIPHENHYDRAMINE HCL 25 MG PO CAPS: 25.0000 mg | ORAL_CAPSULE | Freq: Four times a day (QID) | ORAL | Status: DC | PRN

## 2015-12-03 MED ORDER — ERYTHROMYCIN 5 MG/GM OP OINT
TOPICAL_OINTMENT | OPHTHALMIC | Status: AC
  Filled 2015-12-03: qty 1

## 2015-12-03 MED ORDER — SIMETHICONE 80 MG PO CHEW
80.0000 mg | CHEWABLE_TABLET | Freq: Three times a day (TID) | ORAL | Status: DC
  Administered 2015-12-03 – 2015-12-07 (×11): 80 mg via ORAL
  Filled 2015-12-03 (×11): qty 1

## 2015-12-03 MED ORDER — EPHEDRINE 5 MG/ML INJ
INTRAVENOUS | Status: AC
  Filled 2015-12-03: qty 10

## 2015-12-03 MED ORDER — ONDANSETRON HCL 4 MG/2ML IJ SOLN
INTRAMUSCULAR | Status: DC | PRN
  Administered 2015-12-03: 4 mg via INTRAVENOUS

## 2015-12-03 MED ORDER — MORPHINE SULFATE (PF) 0.5 MG/ML IJ SOLN
INTRAMUSCULAR | Status: DC | PRN
  Administered 2015-12-03: .2 mg via INTRATHECAL

## 2015-12-03 MED ORDER — NALOXONE HCL 2 MG/2ML IJ SOSY
1.0000 ug/kg/h | PREFILLED_SYRINGE | INTRAVENOUS | Status: DC | PRN
  Filled 2015-12-03: qty 2

## 2015-12-03 MED ORDER — SENNOSIDES-DOCUSATE SODIUM 8.6-50 MG PO TABS
2.0000 | ORAL_TABLET | ORAL | Status: DC
  Administered 2015-12-03 – 2015-12-06 (×3): 2 via ORAL
  Filled 2015-12-03 (×4): qty 2

## 2015-12-03 MED ORDER — FENTANYL CITRATE (PF) 100 MCG/2ML IJ SOLN
INTRAMUSCULAR | Status: DC | PRN
  Administered 2015-12-03: 20 ug via INTRATHECAL

## 2015-12-03 MED ORDER — DIBUCAINE 1 % RE OINT: 1.0000 "application " | TOPICAL_OINTMENT | RECTAL | Status: DC | PRN

## 2015-12-03 MED ORDER — MENTHOL 3 MG MT LOZG: 1.0000 | LOZENGE | OROMUCOSAL | Status: DC | PRN

## 2015-12-03 MED ORDER — DIPHENHYDRAMINE HCL 25 MG PO CAPS
25.0000 mg | ORAL_CAPSULE | ORAL | Status: DC | PRN
  Administered 2015-12-03 – 2015-12-04 (×2): 25 mg via ORAL
  Filled 2015-12-03 (×2): qty 1

## 2015-12-03 MED ORDER — SCOPOLAMINE 1 MG/3DAYS TD PT72: 1.0000 | MEDICATED_PATCH | Freq: Once | TRANSDERMAL | Status: DC

## 2015-12-03 MED ORDER — LACTATED RINGERS IV SOLN
INTRAVENOUS | Status: DC
  Administered 2015-12-03: 09:00:00 via INTRAVENOUS

## 2015-12-03 MED ORDER — NALOXONE HCL 0.4 MG/ML IJ SOLN: 0.4000 mg | INTRAMUSCULAR | Status: DC | PRN

## 2015-12-03 MED ORDER — LACTATED RINGERS IV SOLN
INTRAVENOUS | Status: DC
  Administered 2015-12-03 (×2): via INTRAVENOUS

## 2015-12-03 SURGICAL SUPPLY — 28 items
BENZOIN TINCTURE PRP APPL 2/3 (GAUZE/BANDAGES/DRESSINGS) ×2 IMPLANT
CHLORAPREP W/TINT 26ML (MISCELLANEOUS) ×2 IMPLANT
CLAMP CORD UMBIL (MISCELLANEOUS) ×2 IMPLANT
CLOSURE STERI STRIP 1/2 X4 (GAUZE/BANDAGES/DRESSINGS) ×2 IMPLANT
CLOTH BEACON ORANGE TIMEOUT ST (SAFETY) ×2 IMPLANT
DRSG OPSITE POSTOP 4X10 (GAUZE/BANDAGES/DRESSINGS) ×2 IMPLANT
ELECT REM PT RETURN 9FT ADLT (ELECTROSURGICAL) ×2
ELECTRODE REM PT RTRN 9FT ADLT (ELECTROSURGICAL) ×1 IMPLANT
EXTRACTOR VACUUM M CUP 4 TUBE (SUCTIONS) IMPLANT
GLOVE BIOGEL PI IND STRL 7.0 (GLOVE) ×1 IMPLANT
GLOVE BIOGEL PI INDICATOR 7.0 (GLOVE) ×1
GLOVE SURG ORTHO 8.0 STRL STRW (GLOVE) ×2 IMPLANT
GOWN STRL REUS W/TWL LRG LVL3 (GOWN DISPOSABLE) ×4 IMPLANT
KIT ABG SYR 3ML LUER SLIP (SYRINGE) ×2 IMPLANT
NEEDLE HYPO 25X5/8 SAFETYGLIDE (NEEDLE) ×2 IMPLANT
NS IRRIG 1000ML POUR BTL (IV SOLUTION) ×2 IMPLANT
PACK C SECTION WH (CUSTOM PROCEDURE TRAY) ×2 IMPLANT
PAD OB MATERNITY 4.3X12.25 (PERSONAL CARE ITEMS) ×2 IMPLANT
PENCIL SMOKE EVAC W/HOLSTER (ELECTROSURGICAL) ×2 IMPLANT
SUT MNCRL 0 VIOLET CTX 36 (SUTURE) ×3 IMPLANT
SUT MON AB 4-0 PS1 27 (SUTURE) ×2 IMPLANT
SUT MONOCRYL 0 CTX 36 (SUTURE) ×3
SUT PDS AB 1 CT  36 (SUTURE)
SUT PDS AB 1 CT 36 (SUTURE) IMPLANT
SUT VIC AB 1 CTX 36 (SUTURE) ×1
SUT VIC AB 1 CTX36XBRD ANBCTRL (SUTURE) ×1 IMPLANT
TOWEL OR 17X24 6PK STRL BLUE (TOWEL DISPOSABLE) ×2 IMPLANT
TRAY FOLEY CATH SILVER 14FR (SET/KITS/TRAYS/PACK) ×2 IMPLANT

## 2015-12-03 NOTE — Anesthesia Postprocedure Evaluation (Signed)
Anesthesia Post Note  Patient: Madison [name]  Procedure(s) Performed: Procedure(s) (LRB): PRIMARY  CESAREAN SECTION (N/A)  Patient location during evaluation: Mother Baby Anesthesia Type: Spinal Level of consciousness: awake Pain management: pain level controlled Vital Signs Assessment: post-procedure vital signs reviewed and stable Respiratory status: spontaneous breathing Cardiovascular status: stable Postop Assessment: no headache, no backache and spinal receding Anesthetic complications: no     Last Vitals:  Vitals:   12/03/15 1145 12/03/15 1300  BP: 111/63 97/73  Pulse: 84 76  Resp: 18 20  Temp: 36.9 C 36.6 C    Last Pain:  Vitals:   12/03/15 1300  TempSrc:   PainSc: 3    Pain Goal: Patients Stated Pain Goal: 3 (12/03/15 1045)               Edison PaceWILKERSON,VALERIE

## 2015-12-03 NOTE — Op Note (Signed)
Cesarean Section Procedure Note  Pre-operative Diagnosis: IUP at 39 weeks, Prev traumatic delivery with severe shoulder dystocia desires primary C/S  Post-operative Diagnosis: same  Surgeon: Turner DanielsLOWE,DAVID C   Assistants: tracy, RNFA  Anesthesia: spinal  Procedure:  Low Segment Transverse cesarean section  Procedure Details  The patient was seen in the Holding Room. The risks, benefits, complications, treatment options, and expected outcomes were discussed with the patient.  The patient concurred with the proposed plan, giving informed consent.  The site of surgery properly noted/marked.. A Time Out was held and the above information confirmed.  After induction of anesthesia, the patient was draped and prepped in the usual sterile manner. A Pfannenstiel incision was made and carried down through the subcutaneous tissue to the fascia. Fascial incision was made and extended transversely. The fascia was separated from the underlying rectus tissue superiorly and inferiorly. The peritoneum was identified and entered. Peritoneal incision was extended longitudinally. The utero-vesical peritoneal reflection was incised transversely and the bladder flap was bluntly freed from the lower uterine segment. A low transverse uterine incision was made. Delivered from vertex presentation was a baby with Apgar scores of 9 at one minute and 9 at five minutes. After the umbilical cord was clamped and cut cord blood was obtained for evaluation. The placenta was removed intact and appeared normal. The uterine outline, tubes and ovaries appeared normal. The uterine incision was closed with running locked sutures of 0 monocryl and imbricated with 0 monocryl. Hemostasis was observed. Lavage was carried out until clear. The peritoneum was then closed with 0 monocryl and rectus muscles plicated in the midline.  After hemostasis was assured, the fascia was then reapproximated with running sutures of 0 Vicryl. Irrigation was  applied and after adequate hemostasis was assured, the skin was reapproximated with subcutaneous sutures using 4-0 monocryl.  Instrument, sponge, and needle counts were correct prior the abdominal closure and at the conclusion of the case. The patient received 2 grams cefotetan preoperatively.  Findings: Viable female  Estimated Blood Loss:  700cc         Specimens: Placenta was sent to labor and delivery         Complications:  None

## 2015-12-03 NOTE — Lactation Note (Signed)
This note was copied from a baby's chart. Lactation Consultation Note  Patient Name: Madison [name] Reason for consult: Follow-up assessment Baby at 11 hr of life. Mom reports bf is going well. She denies breast or nipple pain, voiced no concerns. Discussed baby behavior, feeding frequency, baby belly size, voids, wt loss, breast changes, and nipple care. Discussed manual expression and spoon feeding. Given lactation handouts. Aware of OP services and support group. She will call for lactation at next bf.     Maternal Data Has patient been taught Hand Expression?: Yes  Feeding Feeding Type: Breast Fed Length of feed: 10 min  LATCH Score/Interventions Latch: Grasps breast easily, tongue down, lips flanged, rhythmical sucking.  Audible Swallowing: A few with stimulation  Type of Nipple: Everted at rest and after stimulation  Comfort (Breast/Nipple): Soft / non-tender     Hold (Positioning): No assistance needed to correctly position infant at breast.  LATCH Score: 9  Lactation Tools Discussed/Used WIC Program: No   Consult Status Consult Status: Follow-up Date: 12/03/15 Follow-up type: In-patient    Madison [name] [name], 8:07 PM

## 2015-12-03 NOTE — Anesthesia Procedure Notes (Signed)
Spinal  Patient location during procedure: OR Start time: 12/03/2015 8:16 AM End time: 12/03/2015 8:18 AM Staffing Anesthesiologist: Leilani AbleHATCHETT, FRANKLIN Performed: anesthesiologist  Preanesthetic Checklist Completed: patient identified, surgical consent, pre-op evaluation, timeout performed, IV checked, risks and benefits discussed and monitors and equipment checked Spinal Block Patient position: sitting Prep: site prepped and draped and DuraPrep Patient monitoring: heart rate, cardiac monitor, continuous pulse ox and blood pressure Approach: midline Location: L3-4 Injection technique: single-shot Needle Needle type: Sprotte  Needle gauge: 24 G Needle length: 9 cm Needle insertion depth: 5 cm Assessment Sensory level: T4

## 2015-12-03 NOTE — H&P (Signed)
Madison [name] is a 30 y.o. female presenting for primary [name]/s at 39 weeks.  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  Plan primary [name]?/s.  GBS-. OB History    Gravida Para Term Preterm AB Living   2 1 1     1    SAB TAB Ectopic Multiple Live Births           1     Past Medical History:  Diagnosis Date  . Anemia   . Anxiety   . Asthma   . Depression   . Headache    Past Surgical History:  Procedure Laterality Date  . APPENDECTOMY     Family History: family history includes Cancer in her father and paternal uncle; Diabetes in her father; Heart disease in her father, maternal grandfather, maternal grandmother, and paternal grandmother; Hypertension in her father and mother; Stroke in her paternal grandfather. Social History:  reports that she quit smoking about 7 months ago. Her smoking use included Cigarettes. She has never used smokeless tobacco. She reports that she does not drink alcohol or use drugs.     Maternal Diabetes: No Genetic Screening: Normal Maternal Ultrasounds/Referrals: Normal Fetal Ultrasounds or other Referrals:  None Maternal Substance Abuse:  No Significant Maternal Medications:  None Significant Maternal Lab Results:  None Other Comments:  None  ROS History   Blood pressure 119/77, pulse 89, temperature 98.4 F (36.9 [name]), temperature source Oral, resp. rate 16, SpO2 99 %. 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  All questions were answered and informed consent was obtained.  Plan to proceed with low segment transverse Cesarean Section.This patient  has been seen and examined.   All of her questions were answered.  Labs and vital signs reviewed.  Informed consent has been obtained.  The History and Physical is current.   Madison [name] 12/03/2015, 7:41 AM

## 2015-12-03 NOTE — Transfer of Care (Signed)
Immediate Anesthesia Transfer of Care Note  Patient: Georgette ShellRebecca Kraft  Procedure(s) Performed: Procedure(s) with comments: PRIMARY  CESAREAN SECTION (N/A) - Needs RNFA  Patient Location: PACU  Anesthesia Type:Spinal  Level of Consciousness: awake, alert  and oriented  Airway & Oxygen Therapy: Patient Spontanous Breathing  Post-op Assessment: Report given to RN and Post -op Vital signs reviewed and stable  Post vital signs: Reviewed and stable  Last Vitals:  Vitals:   12/03/15 0646  BP: 119/77  Pulse: 89  Resp: 16  Temp: 36.9 C    Last Pain:  Vitals:   12/03/15 0646  TempSrc: Oral      Patients Stated Pain Goal: 3 (12/03/15 0646)  Complications: No apparent anesthesia complications

## 2015-12-03 NOTE — Lactation Note (Signed)
This note was copied from a baby's chart. Lactation Consultation Note  Patient Name: Madison [name] ZOXWR'UToday's Date: 12/03/2015 Reason for consult: Initial assessment Baby at 9 hr of life. Mom was getting out of bed with Rn help. Lactation to go back at next bf.   Maternal Data    Feeding Feeding Type: Breast Fed Length of feed: 10 min  LATCH Score/Interventions                      Lactation Tools Discussed/Used     Consult Status Consult Status: Follow-up Date: 12/03/15 Follow-up type: In-patient    [name] 12/03/2015, 5:58 PM

## 2015-12-03 NOTE — Addendum Note (Signed)
Addendum  created 12/03/15 1405 by Earmon PhoenixValerie P Wilkerson, CRNA   Sign clinical note

## 2015-12-03 NOTE — Anesthesia Postprocedure Evaluation (Signed)
Anesthesia Post Note  Patient: Madison ShellRebecca Gruszka  Procedure(s) Performed: Procedure(s) (LRB): PRIMARY  CESAREAN SECTION (N/A)  Patient location during evaluation: PACU Anesthesia Type: Spinal Level of consciousness: awake Pain management: pain level controlled Vital Signs Assessment: post-procedure vital signs reviewed and stable Respiratory status: spontaneous breathing Cardiovascular status: stable Postop Assessment: no headache, no backache, spinal receding, patient able to bend at knees and no signs of nausea or vomiting Anesthetic complications: no     Last Vitals:  Vitals:   12/03/15 0915 12/03/15 0945  BP: 129/72 122/64  Pulse: (!) 106 84  Resp: (!) 22 16  Temp:      Last Pain:  Vitals:   12/03/15 0945  TempSrc:   PainSc: 4    Pain Goal: Patients Stated Pain Goal: 3 (12/03/15 0646)               HATCHETT JR,JOHN Susann GivensFRANKLIN

## 2015-12-04 LAB — CBC
HCT: 25.8 % — ABNORMAL LOW (ref 36.0–46.0)
Hemoglobin: 8.9 g/dL — ABNORMAL LOW (ref 12.0–15.0)
MCH: 30.8 pg (ref 26.0–34.0)
MCHC: 34.5 g/dL (ref 30.0–36.0)
MCV: 89.3 fL (ref 78.0–100.0)
Platelets: 212 10*3/uL (ref 150–400)
RBC: 2.89 MIL/uL — ABNORMAL LOW (ref 3.87–5.11)
RDW: 15.3 % (ref 11.5–15.5)
WBC: 9.7 10*3/uL (ref 4.0–10.5)

## 2015-12-04 MED ORDER — OXYCODONE-ACETAMINOPHEN 5-325 MG PO TABS
1.0000 | ORAL_TABLET | ORAL | Status: DC | PRN
  Administered 2015-12-04 (×2): 1 via ORAL
  Administered 2015-12-04 – 2015-12-05 (×3): 2 via ORAL
  Administered 2015-12-05 (×2): 1 via ORAL
  Administered 2015-12-05 – 2015-12-07 (×6): 2 via ORAL
  Filled 2015-12-04 (×6): qty 2
  Filled 2015-12-04: qty 1
  Filled 2015-12-04 (×7): qty 2

## 2015-12-04 MED ORDER — HYDROMORPHONE HCL 2 MG PO TABS
2.0000 mg | ORAL_TABLET | ORAL | Status: DC | PRN
  Administered 2015-12-04 (×2): 2 mg via ORAL
  Filled 2015-12-04 (×2): qty 1

## 2015-12-04 MED ORDER — PNEUMOCOCCAL VAC POLYVALENT 25 MCG/0.5ML IJ INJ
0.5000 mL | INJECTION | INTRAMUSCULAR | Status: DC
  Filled 2015-12-04 (×2): qty 0.5

## 2015-12-04 NOTE — Progress Notes (Signed)
Subjective: Postpartum Day 1: Cesarean Delivery Patient reports incisional pain, tolerating PO and no problems voiding.    Objective: Vital signs in last 24 hours: Temp:  [97.9 F (36.6 C)-98.6 F (37 C)] 98.3 F (36.8 C) (10/19 0518) Pulse Rate:  [76-106] 79 (10/19 0518) Resp:  [13-22] 18 (10/19 0518) BP: (70-129)/(45-81) 99/50 (10/19 0518) SpO2:  [93 %-100 %] 95 % (10/19 0518)  Physical Exam:  General: alert and cooperative Lochia: appropriate Uterine Fundus: firm Incision: healing well DVT Evaluation: No evidence of DVT seen on physical exam. Negative Homan's sign. No cords or calf tenderness. No significant calf/ankle edema.   Recent Labs  12/02/15 1130 12/04/15 0545  HGB 9.7* 8.9*  HCT 29.6* 25.8*    Assessment/Plan: Status post Cesarean section. Doing well postoperatively.  Continue current care.  CURTIS,CAROL G 12/04/2015, 8:05 AM

## 2015-12-04 NOTE — Progress Notes (Signed)
Patient states Dilaudid 2 mg is not effective for managing her pain. Patient requested Percocet, stating she has taken med in the last 2 years, tolerated well with some  nausea only.  Dr. Langston Masker notified and order obtained.

## 2015-12-05 NOTE — Lactation Note (Signed)
This note was copied from a baby's chart. Lactation Consultation Note  Patient Name: [name] ZOXWR'UToday's Date: 12/05/2015 Reason for consult: Follow-up assessment   With this mom of a term baby, now 958 hours old. Mom is very full, engorged, and was concerned. I assisted mom with latching the baby in football hold, and she suckled on and off for about 20-30 minutes. With hand expression and hand pump, mom has a good amount of transitional milk. I advised mom to lat flat in bed, after feeding the baby, and masaage her breasts back toward her armpits, for 30-40 minutes, and see if this softens her breasts. Mom can also use hand pump after massage. Mom knows to call for questions/concerns. As needed.    Maternal Data    Feeding Feeding Type: Breast Fed Length of feed: 12 min  LATCH Score/Interventions Latch: Repeated attempts needed to sustain latch, nipple held in mouth throughout feeding, stimulation needed to elicit sucking reflex.  Audible Swallowing: A few with stimulation (lots of easily expressed transitional milk) Intervention(s): Skin to skin;Hand expression  Type of Nipple: Everted at rest and after stimulation  Comfort (Breast/Nipple): Filling, red/small blisters or bruises, mild/mod discomfort  Problem noted: Filling Interventions (Filling): Hand pump;Frequent nursing;Massage;Reverse pressure  Hold (Positioning): Assistance needed to correctly position infant at breast and maintain latch. Intervention(s): Breastfeeding basics reviewed;Support Pillows;Position options;Skin to skin  LATCH Score: 6  Lactation Tools Discussed/Used     Consult Status Consult Status: Follow-up Date: 12/06/15 Follow-up type: In-patient    Alfred LevinsLee, Christine Anne 12/05/2015, 6:58 PM

## 2015-12-05 NOTE — Progress Notes (Signed)
Subjective: Postpartum Day 2: Cesarean Delivery Patient reports incisional pain, tolerating PO, + flatus and no problems voiding.    Objective: Vital signs in last 24 hours: Temp:  [98.6 F (37 C)] 98.6 F (37 C) (10/20 0557) Pulse Rate:  [79] 79 (10/20 0557) Resp:  [18] 18 (10/20 0557) BP: (121)/(65) 121/65 (10/20 0557)  Physical Exam:  General: alert and cooperative Lochia: appropriate Uterine Fundus: firm Incision: healing well DVT Evaluation: No evidence of DVT seen on physical exam. Negative Homan's sign. No cords or calf tenderness. No significant calf/ankle edema.   Recent Labs  12/02/15 1130 12/04/15 0545  HGB 9.7* 8.9*  HCT 29.6* 25.8*    Assessment/Plan: Status post Cesarean section. Doing well postoperatively.  Continue current care.  CURTIS,CAROL G 12/05/2015, 8:06 AM

## 2015-12-06 LAB — CBC
HCT: 26.3 % — ABNORMAL LOW (ref 36.0–46.0)
Hemoglobin: 8.7 g/dL — ABNORMAL LOW (ref 12.0–15.0)
MCH: 29.6 pg (ref 26.0–34.0)
MCHC: 33.1 g/dL (ref 30.0–36.0)
MCV: 89.5 fL (ref 78.0–100.0)
Platelets: 220 10*3/uL (ref 150–400)
RBC: 2.94 MIL/uL — ABNORMAL LOW (ref 3.87–5.11)
RDW: 15.4 % (ref 11.5–15.5)
WBC: 6.8 10*3/uL (ref 4.0–10.5)

## 2015-12-06 MED ORDER — BISACODYL 10 MG RE SUPP
10.0000 mg | Freq: Once | RECTAL | Status: AC
  Administered 2015-12-06: 10 mg via RECTAL
  Filled 2015-12-06: qty 1

## 2015-12-06 MED ORDER — ONDANSETRON HCL 4 MG PO TABS
4.0000 mg | ORAL_TABLET | Freq: Three times a day (TID) | ORAL | Status: DC | PRN
  Administered 2015-12-06: 4 mg via ORAL
  Filled 2015-12-06: qty 1

## 2015-12-06 MED ORDER — FAMOTIDINE 20 MG PO TABS
40.0000 mg | ORAL_TABLET | Freq: Every day | ORAL | Status: DC
  Administered 2015-12-06 – 2015-12-07 (×2): 40 mg via ORAL
  Filled 2015-12-06 (×2): qty 2

## 2015-12-06 NOTE — Lactation Note (Signed)
This note was copied from a baby's chart. Lactation Consultation Note  Mom's breasts are very full and baby is not draining them. She is chewing at the breast. Suck evaluation done and baby is not elevating tongue well to achieve steady compression. SNS used to aid in supplementation with expressed BM. She did not transfer without stimulation but when she did suckle mom's breasts softened somewhat.  Plan is to BF with SNS and for mom to post pump. Follow-up later today if mom calls for assist.  Patient Name: Madison Georgette ShellRebecca [name] WUJWJ'XToday's Date: 12/06/2015 Reason for consult: Follow-up assessment;Infant weight loss   Maternal Data    Feeding Feeding Type: Breast Fed Length of feed: 20 min  LATCH Score/Interventions Latch: Repeated attempts needed to sustain latch, nipple held in mouth throughout feeding, stimulation needed to elicit sucking reflex.  Audible Swallowing: A few with stimulation  Type of Nipple: Everted at rest and after stimulation  Comfort (Breast/Nipple): Soft / non-tender     Hold (Positioning): Assistance needed to correctly position infant at breast and maintain latch.  LATCH Score: 7  Lactation Tools Discussed/Used Tools: 40F feeding tube / Syringe   Consult Status Consult Status: Follow-up Date: 12/07/15 Follow-up type: In-patient    Soyla DryerJoseph, Maryann 12/06/2015, 10:02 AM

## 2015-12-06 NOTE — Progress Notes (Signed)
Subjective: Postpartum Day 3: Cesarean Delivery Patient reports incisional pain, tolerating PO, + flatus and no problems voiding.    Abdomen feels tight and uncomfortable. Flatus yesterday, none yet today. No N/V.  Objective: Vital signs in last 24 hours: Temp:  [98 F (36.7 C)-98.1 F (36.7 C)] 98.1 F (36.7 C) (10/21 0552) Pulse Rate:  [65-93] 78 (10/21 0552) Resp:  [14-18] 18 (10/21 0552) BP: (114-120)/(60-61) 120/60 (10/21 0552)  Physical Exam:  General: alert, cooperative, no distress and abdoment soft but mildly distended. BS + but subdued. Lochia: appropriate Uterine Fundus: firm Incision: healing well DVT Evaluation: No evidence of DVT seen on physical exam.   Recent Labs  12/04/15 0545  HGB 8.9*  HCT 25.8*    Assessment/Plan: Status post Cesarean section. Postoperative course complicated by mild ileus  Continue current care Dulcolax suppository.fluids, observation Check CBC,   TOMBLIN II,JAMES E 12/06/2015, 8:15 AM

## 2015-12-06 NOTE — Progress Notes (Signed)
Patient reports having another bowel movement this evening but still having discomfort in lower abdomen that is increasing with some distention; encouraged patient to walk the halls and drink plenty of fluids.

## 2015-12-07 MED ORDER — IBUPROFEN 600 MG PO TABS: 600.0000 mg | ORAL_TABLET | Freq: Four times a day (QID) | ORAL | 0 refills | Status: DC | PRN

## 2015-12-07 MED ORDER — OXYCODONE-ACETAMINOPHEN 5-325 MG PO TABS: 1.0000 | ORAL_TABLET | Freq: Four times a day (QID) | ORAL | 0 refills | Status: DC | PRN

## 2015-12-07 NOTE — Discharge Summary (Signed)
Obstetric Discharge Summary Reason for Admission: cesarean section Prenatal Procedures: none Intrapartum Procedures: cesarean: low cervical, transverse Postpartum Procedures: none Complications-Operative and Postpartum: mild ileus Hemoglobin  Date Value Ref Range Status  12/06/2015 8.7 (L) 12.0 - 15.0 g/dL Final   HCT  Date Value Ref Range Status  12/06/2015 26.3 (L) 36.0 - 46.0 % Final    Physical Exam:  General: alert, cooperative and no distress Lochia: appropriate Uterine Fundus: firm Incision: healing well DVT Evaluation: No evidence of DVT seen on physical exam.  Discharge Diagnoses: Term Pregnancy-delivered  Discharge Information: Date: 12/07/2015 Activity: pelvic rest Diet: routine Medications: PNV, Ibuprofen, Iron and Percocet Condition: stable Instructions: refer to practice specific booklet Discharge to: home   Newborn Data: Live born female  Birth Weight: 9 lb 4 oz (4195 g) APGAR: 9, 9  Home with mother.  TOMBLIN II,JAMES E 12/07/2015, 8:36 AM

## 2015-12-07 NOTE — Progress Notes (Signed)
Subjective: Postpartum Day 4: Cesarean Delivery Patient reports tolerating PO, + flatus, + BM and no problems voiding.     Objective: Vital signs in last 24 hours: Temp:  [97.6 F (36.4 C)-98.3 F (36.8 C)] 97.6 F (36.4 C) (10/22 16100633) Pulse Rate:  [73-85] 73 (10/22 0633) Resp:  [18] 18 (10/22 [id number]) BP: (112-123)/(61-64) 123/61 (10/22 [id number])  Physical Exam:  General: alert, cooperative and no distress Lochia: appropriate Uterine Fundus: firm Incision: healing well DVT Evaluation: No evidence of DVT seen on physical exam. Abdomen soft, good BS  Recent Labs  12/06/15 0835  HGB 8.7*  HCT 26.3*    Assessment/Plan: Status post Cesarean section. Doing well postoperatively.  Discharge home with standard precautions and return to clinic in 4-6 weeks.  TOMBLIN II,JAMES E 12/07/2015, 8:30 AM

## 2015-12-07 NOTE — Lactation Note (Signed)
This note was copied from a baby's chart. Lactation Consultation Note  Patient Name: Madison [name] ZOXWR'UToday's Date: 12/07/2015 Reason for consult: Follow-up assessment;Breast/nipple pain;Infant weight loss  Visited with Mom on day of discharge, baby 774 days old. Baby 9% weight loss.   Mom all packed and ready to leave.  Baby sleeping in her arms.  5 voids and last stool noted 13 hrs ago.  Mom has supplemented twice with EBM 9 and 12 ml using SNS at the breast.  Burped baby and woke her to observe her latch.  Mom placed her in cross cradle on her left side.  Nipple bifurcated on left side, with some blistering noted on upper outer tip.  Breasts are full, but not engorged.  Mom needing guidance on proper hand placement.  Mom pulling down on chin to open baby's mouth.  Baby latched onto base of nipple.  Baby sucked a couple times before becoming sleepy.  Assisted with waiting for a wider gape in mouth.  Baby able to latch deeply on breast, but quickly fell asleep.  Encouraged her to keep baby skin to skin when feeding, and use alternate breast compression.  Talked about post feeding pumping.  Recommended she pump both breasts for 15 minutes after breast feeding, and feed baby her EBM using SNS on finger.  Instructed on how to do this.  Talked about engorgement treatment and importance of feeding baby with a deep wide latch every 2 hrs during the day.  To use EBM on nipples, and coconut oil to treat blistering of nipples. [facility]  To call prn.  Reminded Mom of OP lactation services available.  Consult Status Consult Status: Complete Date: 12/07/15 Follow-up type: Call as needed    Judee ClaraSmith, Caroline E 12/07/2015, 10:15 AM

## 2015-12-07 NOTE — Discharge Instructions (Signed)
No vaginal entry °No heavy lifting °No operation of automobiles °

## 2017-02-15 HISTORY — PX: ABDOMINOPLASTY: SUR9

## 2017-02-15 HISTORY — PX: BREAST ENHANCEMENT SURGERY: SHX7

## 2018-02-27 ENCOUNTER — Other Ambulatory Visit: Payer: Self-pay | Admitting: Endocrinology

## 2018-02-27 ENCOUNTER — Other Ambulatory Visit (HOSPITAL_COMMUNITY): Payer: Self-pay | Admitting: Endocrinology

## 2018-02-27 DIAGNOSIS — E0590 Thyrotoxicosis, unspecified without thyrotoxic crisis or storm: Secondary | ICD-10-CM

## 2018-03-13 ENCOUNTER — Encounter (HOSPITAL_COMMUNITY)
Admission: RE | Admit: 2018-03-13 | Discharge: 2018-03-13 | Disposition: A | Discharge status: Home / Self Care | Payer: 59 | Source: Ambulatory Visit | Attending: Endocrinology | Admitting: Endocrinology

## 2018-03-13 DIAGNOSIS — E0590 Thyrotoxicosis, unspecified without thyrotoxic crisis or storm: Secondary | ICD-10-CM | POA: Insufficient documentation

## 2018-03-14 ENCOUNTER — Encounter (HOSPITAL_COMMUNITY)
Admission: RE | Admit: 2018-03-14 | Discharge: 2018-03-14 | Disposition: A | Discharge status: Home / Self Care | Payer: 59 | Source: Ambulatory Visit | Attending: Endocrinology | Admitting: Endocrinology

## 2019-07-25 ENCOUNTER — Other Ambulatory Visit: Payer: Self-pay | Admitting: Radiology

## 2019-07-25 DIAGNOSIS — N6320 Unspecified lump in the left breast, unspecified quadrant: Secondary | ICD-10-CM

## 2019-07-30 ENCOUNTER — Ambulatory Visit
Admission: RE | Admit: 2019-07-30 | Discharge: 2019-07-30 | Disposition: A | Discharge status: Home / Self Care | Payer: 59 | Source: Ambulatory Visit | Attending: Radiology | Admitting: Radiology

## 2019-07-30 ENCOUNTER — Other Ambulatory Visit: Payer: Self-pay | Admitting: Radiology

## 2019-07-30 ENCOUNTER — Other Ambulatory Visit: Payer: Self-pay

## 2019-07-30 DIAGNOSIS — N6320 Unspecified lump in the left breast, unspecified quadrant: Secondary | ICD-10-CM

## 2019-07-31 ENCOUNTER — Ambulatory Visit
Admission: RE | Admit: 2019-07-31 | Discharge: 2019-07-31 | Disposition: A | Discharge status: Home / Self Care | Payer: 59 | Source: Ambulatory Visit | Attending: Radiology | Admitting: Radiology

## 2019-07-31 DIAGNOSIS — N6320 Unspecified lump in the left breast, unspecified quadrant: Secondary | ICD-10-CM

## 2019-08-09 ENCOUNTER — Other Ambulatory Visit: Payer: Self-pay

## 2019-11-01 ENCOUNTER — Emergency Department (HOSPITAL_BASED_OUTPATIENT_CLINIC_OR_DEPARTMENT_OTHER): Payer: 59

## 2019-11-01 ENCOUNTER — Other Ambulatory Visit: Payer: Self-pay

## 2019-11-01 ENCOUNTER — Encounter (HOSPITAL_BASED_OUTPATIENT_CLINIC_OR_DEPARTMENT_OTHER): Payer: Self-pay | Admitting: Emergency Medicine

## 2019-11-01 ENCOUNTER — Emergency Department (HOSPITAL_BASED_OUTPATIENT_CLINIC_OR_DEPARTMENT_OTHER)
Admission: EM | Admit: 2019-11-01 | Discharge: 2019-11-01 | Disposition: A | Discharge status: Home / Self Care | Payer: 59 | Attending: Emergency Medicine | Admitting: Emergency Medicine

## 2019-11-01 DIAGNOSIS — R000 Tachycardia, unspecified: Secondary | ICD-10-CM | POA: Insufficient documentation

## 2019-11-01 DIAGNOSIS — J45909 Unspecified asthma, uncomplicated: Secondary | ICD-10-CM | POA: Insufficient documentation

## 2019-11-01 DIAGNOSIS — R079 Chest pain, unspecified: Secondary | ICD-10-CM | POA: Diagnosis present

## 2019-11-01 DIAGNOSIS — R0602 Shortness of breath: Secondary | ICD-10-CM

## 2019-11-01 DIAGNOSIS — Z87891 Personal history of nicotine dependence: Secondary | ICD-10-CM | POA: Insufficient documentation

## 2019-11-01 DIAGNOSIS — U071 COVID-19: Secondary | ICD-10-CM

## 2019-11-01 DIAGNOSIS — Z91040 Latex allergy status: Secondary | ICD-10-CM | POA: Diagnosis not present

## 2019-11-01 LAB — HCG, QUANTITATIVE, PREGNANCY: hCG, Beta Chain, Quant, S: <1 m[IU]/mL (ref <5)

## 2019-11-01 LAB — CBC
HCT: 38.2 % (ref 36.0–46.0)
Hemoglobin: 12.8 g/dL (ref 12.0–15.0)
MCH: 30.9 pg (ref 26.0–34.0)
MCHC: 33.5 g/dL (ref 30.0–36.0)
MCV: 92.3 fL (ref 80.0–100.0)
Platelets: 195 10*3/uL (ref 150–400)
RBC: 4.14 MIL/uL (ref 3.87–5.11)
RDW: 14.2 % (ref 11.5–15.5)
WBC: 5.6 10*3/uL (ref 4.0–10.5)
nRBC: 0 % (ref 0.0–0.2)

## 2019-11-01 LAB — COMPREHENSIVE METABOLIC PANEL
ALT: 10 U/L (ref 0–44)
AST: 15 U/L (ref 15–41)
Albumin: 3.4 g/dL — ABNORMAL LOW (ref 3.5–5.0)
Alkaline Phosphatase: 38 U/L (ref 38–126)
Anion gap: 7 (ref 5–15)
BUN: 6 mg/dL (ref 6–20)
CO2: 23 mmol/L (ref 22–32)
Calcium: 7.9 mg/dL — ABNORMAL LOW (ref 8.9–10.3)
Chloride: 105 mmol/L (ref 98–111)
Creatinine, Ser: 0.61 mg/dL (ref 0.44–1.00)
GFR calc Af Amer: >60 mL/min (ref >60)
GFR calc non Af Amer: >60 mL/min (ref >60)
Glucose, Bld: 92 mg/dL (ref 70–99)
Potassium: 4.1 mmol/L (ref 3.5–5.1)
Sodium: 135 mmol/L (ref 135–145)
Total Bilirubin: 0.5 mg/dL (ref 0.3–1.2)
Total Protein: 6.1 g/dL — ABNORMAL LOW (ref 6.5–8.1)

## 2019-11-01 LAB — TROPONIN I (HIGH SENSITIVITY): Troponin I (High Sensitivity): <2 ng/L (ref <18)

## 2019-11-01 MED ORDER — IBUPROFEN 400 MG PO TABS
ORAL_TABLET | ORAL | Status: AC
  Filled 2019-11-01: qty 1

## 2019-11-01 MED ORDER — IBUPROFEN 200 MG PO TABS
ORAL_TABLET | ORAL | Status: AC
  Filled 2019-11-01: qty 1

## 2019-11-01 MED ORDER — DIPHENHYDRAMINE HCL 50 MG/ML IJ SOLN
25.0000 mg | Freq: Once | INTRAMUSCULAR | Status: AC
  Administered 2019-11-01: 25 mg via INTRAVENOUS
  Filled 2019-11-01: qty 1

## 2019-11-01 MED ORDER — SODIUM CHLORIDE 0.9 % IV BOLUS
1000.0000 mL | Freq: Once | INTRAVENOUS | Status: AC
  Administered 2019-11-01: 1000 mL via INTRAVENOUS

## 2019-11-01 MED ORDER — IBUPROFEN 800 MG PO TABS
800.0000 mg | ORAL_TABLET | Freq: Once | ORAL | Status: AC
  Administered 2019-11-01: 800 mg via ORAL
  Filled 2019-11-01: qty 1

## 2019-11-01 MED ORDER — METOCLOPRAMIDE HCL 5 MG/ML IJ SOLN
10.0000 mg | Freq: Once | INTRAMUSCULAR | Status: AC
  Administered 2019-11-01: 10 mg via INTRAVENOUS
  Filled 2019-11-01: qty 2

## 2019-11-01 NOTE — ED Provider Notes (Addendum)
[facility] EMERGENCY DEPARTMENT Provider Note   CSN: [id number] Arrival date & time: 11/01/19  1038     History Chief Complaint  Patient presents with  . Chest Pain  . Shortness of Breath    Madison [name] is a 34 y.o. female with a past medical history of asthma presenting to the ED with Covid symptoms.  Tested positive for Covid 2 days ago.  Reports myalgias, nausea, decreased appetite, and intermittent chest pain worse with coughing and movement, shortness of breath, dizziness, productive cough, rhinorrhea, fever despite taking antipyretics.  She has been taking Tylenol cold and flu with only minimal improvement in her symptoms.  She is a longtime smoker.  Denies abdominal pain.  Reports myalgias throughout her back and extremities.  Reports headache and sinus pressure as well.  Denies any loss of consciousness, vision changes, numbness in arms or legs or supplemental oxygen use at baseline.  States that she has been checking her oxygen saturations at home at rest and then will be in the 80s.  HPI     Past Medical History:  Diagnosis Date  . Anemia   . Anxiety   . Asthma   . Depression   . Headache     Patient Active Problem List   Diagnosis Date Noted  . Pregnancy 12/03/2015  . Cesarean delivery delivered 12/03/2015    Past Surgical History:  Procedure Laterality Date  . APPENDECTOMY    . CESAREAN SECTION N/A 12/03/2015   Procedure: PRIMARY  CESAREAN SECTION;  Surgeon: Candice Camp, MD;  Location: [facility];  Service: Obstetrics;  Laterality: N/A;  Needs RNFA     OB History    Gravida  2   Para  2   Term  2   Preterm      AB      Living  2     SAB      TAB      Ectopic      Multiple  0   Live Births  2           Family History  Problem Relation Age of Onset  . Hypertension Mother   . Diabetes Father   . Heart disease Father   . Hypertension Father   . Cancer Father   . Cancer Paternal Uncle   . Heart disease Maternal  Grandfather   . Stroke Paternal Grandfather   . Heart disease Maternal Grandmother   . Heart disease Paternal Grandmother     Social History   Tobacco Use  . Smoking status: Former Smoker    Types: Cigarettes    Quit date: 04/08/2015    Years since quitting: 4.5  . Smokeless tobacco: Never Used  Substance Use Topics  . Alcohol use: No  . Drug use: No    Home Medications Prior to Admission medications   Medication Sig Start Date End Date Taking? Authorizing Provider  ibuprofen (ADVIL,MOTRIN) 600 MG tablet Take 1 tablet (600 mg total) by mouth every 6 (six) hours as needed. 12/07/15   Harold Hedge, MD  IRON PO Take 1 tablet by mouth daily.    [provider]  oxyCODONE-acetaminophen (PERCOCET/ROXICET) 5-325 MG tablet Take 1-2 tablets by mouth every 6 (six) hours as needed for moderate pain or severe pain (1 tab for moderate pain 4-7; 2 tabs pain >7). 12/07/15   Harold Hedge, MD  Prenatal Vit-Fe Fumarate-FA (PRENATAL MULTIVITAMIN) TABS tablet Take 1 tablet by mouth daily at 12 noon.  [provider]    Allergies    Latex, Codeine, and Hydrocodone  Review of Systems   Review of Systems  Constitutional: Positive for activity change, appetite change, chills, fatigue and fever.  HENT: Positive for sinus pressure. Negative for ear pain, rhinorrhea, sneezing and sore throat.   Eyes: Negative for photophobia and visual disturbance.  Respiratory: Positive for cough, chest tightness and shortness of breath. Negative for wheezing.   Cardiovascular: Positive for chest pain. Negative for palpitations.  Gastrointestinal: Positive for nausea. Negative for abdominal pain, blood in stool, constipation, diarrhea and vomiting.  Genitourinary: Negative for dysuria, hematuria and urgency.  Musculoskeletal: Positive for myalgias.  Skin: Negative for rash.  Neurological: Positive for light-headedness and headaches. Negative for dizziness and weakness.    Physical  Exam Updated Vital Signs BP 112/68   Pulse 91   Temp 99.6 F (37.6 C) (Oral)   Resp 16   Ht 5\' 5"  (1.651 m)   Wt 61.7 kg   LMP 10/25/2019   SpO2 96%   BMI 22.63 kg/m   Physical Exam Vitals and nursing note reviewed.  Constitutional:      General: She is not in acute distress.    Appearance: She is well-developed.  HENT:     Head: Normocephalic and atraumatic.     Nose: Nose normal.  Eyes:     General: No scleral icterus.       Right eye: No discharge.        Left eye: No discharge.     Conjunctiva/sclera: Conjunctivae normal.     Pupils: Pupils are equal, round, and reactive to light.  Cardiovascular:     Rate and Rhythm: Regular rhythm. Tachycardia present.     Heart sounds: Normal heart sounds. No murmur heard.  No friction rub. No gallop.   Pulmonary:     Effort: Pulmonary effort is normal. Tachypnea present. No respiratory distress.     Breath sounds: Normal breath sounds.  Abdominal:     General: Bowel sounds are normal. There is no distension.     Palpations: Abdomen is soft.     Tenderness: There is no abdominal tenderness. There is no guarding.  Musculoskeletal:        General: Normal range of motion.     Cervical back: Normal range of motion and neck supple.     Right lower leg: No edema.     Left lower leg: No edema.  Skin:    General: Skin is warm and dry.     Findings: No rash.  Neurological:     Mental Status: She is alert.     Motor: No abnormal muscle tone.     Coordination: Coordination normal.     ED Results / Procedures / Treatments   Labs (all labs ordered are listed, but only abnormal results are displayed) Labs Reviewed  COMPREHENSIVE METABOLIC PANEL - Abnormal; Notable for the following components:      Result Value   Calcium 7.9 (*)    Total Protein 6.1 (*)    Albumin 3.4 (*)    All other components within normal limits  CBC  HCG, QUANTITATIVE, PREGNANCY  TROPONIN I (HIGH SENSITIVITY)    EKG None  ED ECG REPORT   Date:  11/01/2019  Rate: 99  Rhythm: normal sinus rhythm  QRS Axis: normal  Intervals: normal  ST/T Wave abnormalities: normal  Conduction Disutrbances:none  Narrative Interpretation:   Old EKG Reviewed: none available  I have personally reviewed the EKG tracing  and agree with the computerized printout as noted.  Radiology DG Chest Port 1 View  Result Date: 11/01/2019 CLINICAL DATA:  Chest pain EXAM: PORTABLE CHEST 1 VIEW COMPARISON:  None. FINDINGS: The cardiomediastinal silhouette is normal in contour. No pleural effusion. No pneumothorax. No acute pleuroparenchymal abnormality. Visualized abdomen is unremarkable. No acute osseous abnormality noted. IMPRESSION: No acute cardiopulmonary abnormality. Electronically Signed   By: Meda Klinefelter MD   On: 11/01/2019 11:14    Procedures Procedures (including critical care time)  Medications Ordered in ED Medications  ibuprofen (ADVIL) tablet 800 mg (800 mg Oral Given 11/01/19 1109)  sodium chloride 0.9 % bolus 1,000 mL (0 mLs Intravenous Stopped 11/01/19 1216)  metoCLOPramide (REGLAN) injection 10 mg (10 mg Intravenous Given 11/01/19 1114)  diphenhydrAMINE (BENADRYL) injection 25 mg (25 mg Intravenous Given 11/01/19 1113)    ED Course  I have reviewed the triage vital signs and the nursing notes.  Pertinent labs & imaging results that were available during my care of the patient were reviewed by me and considered in my medical decision making (see chart for details).    MDM Rules/Calculators/A&P                          Lisa-Marie Rueger was evaluated in Emergency Department on 11/01/19 for the symptoms described in the history of present illness. He/she was evaluated in the context of the global COVID-19 pandemic, which necessitated consideration that the patient might be at risk for infection with the SARS-CoV-2 virus that causes COVID-19. Institutional protocols and algorithms that pertain to the evaluation of patients at risk for COVID-19  are in a state of rapid change based on information released by regulatory bodies including the CDC and federal and state organizations. These policies and algorithms were followed during the patient's care in the ED.  34 year old female with a past medical history of asthma presenting to the ED with Covid symptoms.  Reports myalgias, nausea, decreased appetite, intermittent chest pain, productive cough, dizziness, fever.  Diagnosed with Covid 2 days ago when her symptoms began.  She is febrile on arrival here with last antipyretic use being 3 hours ago.  She is tachycardic which I believe is due to her fever.  Oxygen saturations are 96% and above on room air, she ambulated here with oxygen saturations remaining in upper 90s.  EKG here shows sinus rhythm, no ischemic changes.  Chest x-ray without any acute findings.  hCG is negative.  CMP, CBC and troponin unremarkable.  Patient given IV fluids, Advil, migraine cocktail here with complete resolution of her symptoms.  Her tachycardia has improved with defervescence.  Suspect that her symptoms are due to her Covid infection.  I have tried to arrange a Mab infusion for her as she qualifies due to her history of asthma.  She will be contacted regarding this.  In the meantime she will continue supportive medications, following up as needed and increasing hydration.  Patient is comfortable with discharge home.  Strict return precautions given.  All imaging, if done today, including plain films, CT scans, and ultrasounds, independently reviewed by me, and interpretations confirmed via formal radiology reads.  Patient is hemodynamically stable, in NAD, and able to ambulate in the ED. Evaluation does not show pathology that would require ongoing emergent intervention or inpatient treatment. I explained the diagnosis to the patient. Pain has been managed and has no complaints prior to discharge. Patient is comfortable with above plan and is stable  for discharge at this  time. All questions were answered prior to disposition. Strict return precautions for returning to the ED were discussed. Encouraged follow up with PCP.   An After Visit Summary was printed and given to the patient.   Portions of this note were generated with Scientist, clinical (histocompatibility and immunogenetics). Dictation errors may occur despite best attempts at proofreading.  Final Clinical Impression(s) / ED Diagnoses Final diagnoses:  COVID-19 virus infection    Rx / DC Orders ED Discharge Orders    None         Dietrich Pates, PA-C 11/01/19 1349    Tilden Fossa, MD 11/01/19 1456

## 2019-11-01 NOTE — Discharge Instructions (Signed)
You will be contacted regarding your Mab infusion. In the meantime continue taking Tylenol, Motrin drink plenty of fluids. Return to the ER if you start to experience chest pain, shortness of breath, worsening headache or vomiting.

## 2019-11-01 NOTE — ED Notes (Signed)
Pt discharged to home. Discharge instructions have been discussed with patient and/or family members. Pt verbally acknowledges understanding d/c instructions, and endorses comprehension to checkout at registration before leaving.  °

## 2019-11-01 NOTE — ED Triage Notes (Signed)
covid positive x 3 days , chest pain and shortness of breath. Productive cough

## 2019-11-01 NOTE — ED Notes (Signed)
Patient ambulated on r/a SpO2 97%, HR 105-115, endorses dizziness upon standing.

## 2019-11-03 ENCOUNTER — Telehealth: Payer: Self-pay | Admitting: Unknown Physician Specialty

## 2019-11-03 NOTE — Telephone Encounter (Signed)
Called to discuss with Madison [name] about Covid symptoms and the use of bamlanivimab, a monoclonal antibody infusion for those with mild to moderate Covid symptoms and at a high risk of hospitalization.     Pt is qualified for this infusion at the [facility] infusion center due to co-morbid conditions and/or a member of an at-risk group, however is vaccinated with mild symptoms.  MAB not recommended

## 2019-11-03 NOTE — Telephone Encounter (Signed)
Called to Discuss with patient about Covid symptoms and the use of the monoclonal antibody infusion for those with mild to moderate Covid symptoms and at a high risk of hospitalization.     Pt appears to qualify for this infusion due to co-morbid conditions and/or a member of an at-risk group in accordance with the FDA Emergency Use Authorization.    Unable to reach pt   Mychart message sent

## 2019-12-24 ENCOUNTER — Other Ambulatory Visit: Payer: Self-pay

## 2019-12-24 ENCOUNTER — Encounter: Payer: Self-pay | Admitting: Allergy and Immunology

## 2019-12-24 ENCOUNTER — Ambulatory Visit (INDEPENDENT_AMBULATORY_CARE_PROVIDER_SITE_OTHER): Payer: 59 | Admitting: Allergy and Immunology

## 2019-12-24 VITALS — BP 116/78 | HR 88 | Temp 98.5°F | Resp 20 | Ht 65.0 in | Wt 132.6 lb

## 2019-12-24 DIAGNOSIS — Z91040 Latex allergy status: Secondary | ICD-10-CM

## 2019-12-24 DIAGNOSIS — H1010 Acute atopic conjunctivitis, unspecified eye: Secondary | ICD-10-CM | POA: Insufficient documentation

## 2019-12-24 DIAGNOSIS — J3089 Other allergic rhinitis: Secondary | ICD-10-CM | POA: Insufficient documentation

## 2019-12-24 DIAGNOSIS — T7800XA Anaphylactic reaction due to unspecified food, initial encounter: Secondary | ICD-10-CM

## 2019-12-24 DIAGNOSIS — H1013 Acute atopic conjunctivitis, bilateral: Secondary | ICD-10-CM | POA: Diagnosis not present

## 2019-12-24 MED ORDER — EPINEPHRINE 0.3 MG/0.3ML IJ SOAJ: 0.3000 mg | INTRAMUSCULAR | 1 refills | Status: AC | PRN

## 2019-12-24 MED ORDER — OLOPATADINE HCL 0.2 % OP SOLN: 1.0000 [drp] | Freq: Every day | OPHTHALMIC | 5 refills | Status: AC | PRN

## 2019-12-24 MED ORDER — AZELASTINE HCL 0.15 % NA SOLN: NASAL | 5 refills | Status: AC

## 2019-12-24 MED ORDER — LEVOCETIRIZINE DIHYDROCHLORIDE 5 MG PO TABS: 5.0000 mg | ORAL_TABLET | Freq: Every day | ORAL | 5 refills | Status: AC | PRN

## 2019-12-24 NOTE — Assessment & Plan Note (Signed)
   Treatment plan as outlined above for allergic rhinitis.  A prescription has been provided for generic Pataday, one drop per eye daily as needed.  If insurance does not cover this medication, medicated allergy eyedrops may be purchased over-the-counter as Pataday Extra Strength or Zaditor.  I have also recommended eye lubricant drops (i.e., Natural Tears) as needed. 

## 2019-12-24 NOTE — Assessment & Plan Note (Addendum)
   Aeroallergen avoidance measures have been discussed and provided in written form.  A prescription has been provided for levocetirizine(Xyzal), 5 mg daily as needed.  To avoid diminishing benefit with daily use (tachyphylaxis) of second generation antihistamine, consider alternating every few months between fexofenadine (Allegra) and levocetirizine (Xyzal).  A prescription has been provided for azelastine nasal spray, 1-2 sprays per nostril 1-2 times daily as needed. Proper nasal spray technique has been discussed and demonstrated.   Nasal saline spray (i.e., Simply Saline) or nasal saline lavage (i.e., NeilMed) is recommended as needed and prior to medicated nasal sprays.  The risks and benefits of aeroallergen immunotherapy have been discussed. The patient is motivated to initiate immunotherapy if insurance coverage is favorable. She will let us know how she would like to proceed.

## 2019-12-24 NOTE — Assessment & Plan Note (Signed)
The patient's history suggests food allergy.  Select food allergen skin testing revealed reactivity to almond.  Of note, skin tests were nonreactive to avocado and pineapple.    A laboratory order form has been provided for serum specific IgE against avocado and pineapple.  Meticulous avoidance of tree nuts as discussed.  Until avocado and pineapple allergy has been ruled out, I have recommended avoiding these foods as well.  A prescription has been provided for epinephrine auto-injector 2 pack along with instructions for proper administration.  A food allergy action plan has been provided and discussed.  Medic Alert identification is recommended.

## 2019-12-24 NOTE — Patient Instructions (Addendum)
Perennial and seasonal allergic rhinitis  Aeroallergen avoidance measures have been discussed and provided in written form.  A prescription has been provided for levocetirizine(Xyzal), 5 mg daily as needed.  To avoid diminishing benefit with daily use (tachyphylaxis) of second generation antihistamine, consider alternating every few months between fexofenadine (Allegra) and levocetirizine (Xyzal).  A prescription has been provided for azelastine nasal spray, 1-2 sprays per nostril 1-2 times daily as needed. Proper nasal spray technique has been discussed and demonstrated.   Nasal saline spray (i.e., Simply Saline) or nasal saline lavage (i.e., NeilMed) is recommended as needed and prior to medicated nasal sprays.  The risks and benefits of aeroallergen immunotherapy have been discussed. The patient is motivated to initiate immunotherapy if insurance coverage is favorable. She will let us know how she would like to proceed.  Allergic conjunctivitis  Treatment plan as outlined above for allergic rhinitis.  A prescription has been provided for generic Pataday, one drop per eye daily as needed.  If insurance does not cover this medication, medicated allergy eyedrops may be purchased over-the-counter as Art therapist.  I have also recommended eye lubricant drops (i.e., Natural Tears) as needed.  Food allergy The patient's history suggests food allergy.  Select food allergen skin testing revealed reactivity to almond.  Of note, skin tests were nonreactive to avocado and pineapple.    A laboratory order form has been provided for serum specific IgE against avocado and pineapple.  Meticulous avoidance of tree nuts as discussed.  Until avocado and pineapple allergy has been ruled out, I have recommended avoiding these foods as well.  A prescription has been provided for epinephrine auto-injector 2 pack along with instructions for proper administration.  A food allergy action  plan has been provided and discussed.  Medic Alert identification is recommended.  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   A laboratory order form has been provided for serum specific IgE against latex.   For now, continue avoidance of latex and have access to epinephrine autoinjectors.   Return in about 4 months (around 04/22/2020), or if symptoms worsen or fail to improve.  Control of Dust Mite Allergen  House dust mites play a major role in allergic asthma and rhinitis.  They occur in environments with high humidity wherever human skin, the food for dust mites is found. High levels have been detected in dust obtained from mattresses, pillows, carpets, upholstered furniture, bed covers, clothes and soft toys.  The principal allergen of the house dust mite is found in its feces.  A gram of dust may contain 1,000 mites and 250,000 fecal particles.  Mite antigen is easily measured in the air during house cleaning activities.    1. Encase mattresses, including the box spring, and pillow, in an air tight cover.  Seal the zipper end of the encased mattresses with wide adhesive tape. 2. Wash the bedding in water of 130 degrees Farenheit weekly.  Avoid cotton comforters/quilts and flannel bedding: the most ideal bed covering is the dacron comforter. 3. Remove all upholstered furniture from the bedroom. 4. Remove carpets, carpet padding, rugs, and non-washable window drapes from the bedroom.  Wash drapes weekly or use plastic window coverings. 5. Remove all non-washable stuffed toys from the bedroom.  Wash stuffed toys weekly. 6. Have the room cleaned frequently with a vacuum cleaner and a damp dust-mop.  The patient should not be in a room which is being cleaned and should wait 1 hour after cleaning before going  into the room. 7. Close and seal all heating outlets in the bedroom.  Otherwise, the room will become filled with dust-laden air.  An electric heater can be used to  heat the room. Reduce indoor humidity to less than 50%.  Do not use a humidifier.   Reducing Pollen Exposure  The American Academy of Allergy, Asthma and Immunology suggests the following steps to reduce your exposure to pollen during allergy seasons.    1. Do not hang sheets or clothing out to dry; pollen may collect on these items. 2. Do not mow lawns or spend time around freshly cut grass; mowing stirs up pollen. 3. Keep windows closed at night.  Keep car windows closed while driving. 4. Minimize morning activities outdoors, a time when pollen counts are usually at their highest. 5. Stay indoors as much as possible when pollen counts or humidity is high and on windy days when pollen tends to remain in the air longer. 6. Use air conditioning when possible.  Many air conditioners have filters that trap the pollen spores. 7. Use a HEPA room air filter to remove pollen form the indoor air you breathe.   Control of Dog or Cat Allergen  Avoidance is the best way to manage a dog or cat allergy. If you have a dog or cat and are allergic to dog or cats, consider removing the dog or cat from the home. If you have a dog or cat but don't want to find it a new home, or if your family wants a pet even though someone in the household is allergic, here are some strategies that may help keep symptoms at bay:  1. Keep the pet out of your bedroom and restrict it to only a few rooms. Be advised that keeping the dog or cat in only one room will not limit the allergens to that room. 2. Don't pet, hug or kiss the dog or cat; if you do, wash your hands with soap and water. 3. High-efficiency particulate air (HEPA) cleaners run continuously in a bedroom or living room can reduce allergen levels over time. 4. Place electrostatic material sheet in the air inlet vent in the bedroom. 5. Regular use of a high-efficiency vacuum cleaner or a central vacuum can reduce allergen levels. 6. Giving your dog or cat a bath  at least once a week can reduce airborne allergen.   Control of Mold Allergen  Mold and fungi can grow on a variety of surfaces provided certain temperature and moisture conditions exist.  Outdoor molds grow on plants, decaying vegetation and soil.  The major outdoor mold, Alternaria and Cladosporium, are found in very high numbers during hot and dry conditions.  Generally, a late Summer - Fall peak is seen for common outdoor fungal spores.  Rain will temporarily lower outdoor mold spore count, but counts rise rapidly when the rainy period ends.  The most important indoor molds are Aspergillus and Penicillium.  Dark, humid and poorly ventilated basements are ideal sites for mold growth.  The next most common sites of mold growth are the bathroom and the kitchen.  Outdoor [employer] 1. Use air conditioning and keep windows closed 2. Avoid exposure to decaying vegetation. 3. Avoid leaf raking. 4. Avoid grain handling. 5. Consider wearing a face mask if working in moldy areas.  Indoor Mold Control 1. Maintain humidity below 50%. 2. Clean washable surfaces with 5% bleach solution. 3. Remove sources e.g. Contaminated carpets.   Control of Cockroach Allergen  Cockroach  allergen has been identified as an important cause of acute attacks of asthma, especially in urban settings.  There are fifty-five species of cockroach that exist in the [place], however only three, the [place], [place] species produce allergen that can affect patients with Asthma.  Allergens can be obtained from fecal particles, egg casings and secretions from cockroaches.    1. Remove food sources. 2. Reduce access to water. 3. Seal access and entry points. 4. Spray runways with 0.5-1% Diazinon or Chlorpyrifos 5. Blow boric acid power under stoves and refrigerator. 6. Place bait stations (hydramethylnon) at feeding sites.

## 2019-12-24 NOTE — Assessment & Plan Note (Signed)
The patient's history suggests latex allergy.   A laboratory order form has been provided for serum specific IgE against latex.   For now, continue avoidance of latex and have access to epinephrine autoinjectors.

## 2019-12-24 NOTE — Progress Notes (Signed)
New Patient Note  RE: Madison [name] MRN: [id number] DOB: January 15, 1986 Date of Office Visit: 12/24/2019  Referring provider: No ref. provider found Primary care provider: Truett Perna, MD  Chief Complaint: Allergic Rhinitis  and Food Intolerance   History of present illness: [name] is a 34 y.o. female presenting today for her initial evaluation. She complains of nasal congestion, rhinorrhea, sneezing, postnasal drainage, nasal pruritus, and ocular pruritus.  The symptoms are triggered by exposure to dogs, cats, and pollen.  She has attempted to control the symptoms with cetirizine which "maybe takes a little bit of the edge off." She reports that approximately 5 years ago she consumed almonds and developed "incredibly severe GI distress; emergency room status."  She has noticed since that time that if she consumes almonds and/or avocado she experiences severe gastrointestinal symptoms including crampy abdominal pain and the sensation of having to have diarrhea, though she is unable to evacuate her bowels.  She does not develop concomitant urticaria, angioedema or cardiopulmonary symptoms.  She denies oral or pharyngeal pruritus with the consumption of almonds and/or avocado.  She is able to consume peanut butter on a regular basis without symptoms.  She reports that she does not consume other tree nuts frequently, however has never noticed adverse symptoms with her consumption.  She reports that she experiences mild oral pruritus/irritation with the consumption of pineapple, however she enjoys consuming pineapple and does not experience other concerning symptoms. Korey has a history of latex allergy.  She had worked in [employer] in the past requiring the wearing of latex gloves "all the time."  Now, she wears latex gloves her hands become "raw and rashy and itchy" where the glove has made contact with the skin.  Assessment and plan: Perennial and seasonal allergic  rhinitis  Aeroallergen avoidance measures have been discussed and provided in written form.  A prescription has been provided for levocetirizine(Xyzal), 5 mg daily as needed.  To avoid diminishing benefit with daily use (tachyphylaxis) of second generation antihistamine, consider alternating every few months between fexofenadine (Allegra) and levocetirizine (Xyzal).  A prescription has been provided for azelastine nasal spray, 1-2 sprays per nostril 1-2 times daily as needed. Proper nasal spray technique has been discussed and demonstrated.   Nasal saline spray (i.e., Simply Saline) or nasal saline lavage (i.e., NeilMed) is recommended as needed and prior to medicated nasal sprays.  The risks and benefits of aeroallergen immunotherapy have been discussed. The patient is motivated to initiate immunotherapy if insurance coverage is favorable. She will let us know how she would like to proceed.  Allergic conjunctivitis  Treatment plan as outlined above for allergic rhinitis.  A prescription has been provided for generic Pataday, one drop per eye daily as needed.  If insurance does not cover this medication, medicated allergy eyedrops may be purchased over-the-counter as Art therapist.  I have also recommended eye lubricant drops (i.e., Natural Tears) as needed.  Food allergy The patient's history suggests food allergy.  Select food allergen skin testing revealed reactivity to almond.  Of note, skin tests were nonreactive to avocado and pineapple.    A laboratory order form has been provided for serum specific IgE against avocado and pineapple.  Meticulous avoidance of tree nuts as discussed.  Until avocado and pineapple allergy has been ruled out, I have recommended avoiding these foods as well.  A prescription has been provided for epinephrine auto-injector 2 pack along with instructions for proper administration.  A food  allergy action plan has been provided and  discussed.  Medic Alert identification is recommended.  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   A laboratory order form has been provided for serum specific IgE against latex.   For now, continue avoidance of latex and have access to epinephrine autoinjectors.   Meds ordered this encounter  Medications  . levocetirizine (XYZAL) 5 MG tablet    Sig: Take 1 tablet (5 mg total) by mouth daily as needed.    Dispense:  30 tablet    Refill:  5  . Azelastine HCl 0.15 % SOLN    Sig: Use 1-2 sprays per nostril twice daily as needed    Dispense:  30 mL    Refill:  5  . Olopatadine HCl (PATADAY) 0.2 % SOLN    Sig: Place 1 drop into both eyes daily as needed.    Dispense:  2.5 mL    Refill:  5  . EPINEPHrine (AUVI-Q) 0.3 mg/0.3 mL IJ SOAJ injection    Sig: Inject 0.3 mg into the muscle as needed for anaphylaxis.    Dispense:  2 each    Refill:  1    Diagnostics: Environmental skin testing: Reactive to ragweed pollen, weed pollen, tree pollen, dust mite antigen, cat hair, and dog epithelia. Food allergen skin testing: Reactive to almond.    Physical examination: Blood pressure 116/78, pulse 88, temperature 98.5 F (36.9 C), temperature source Oral, resp. rate 20, height 5\' 5"  (1.651 m), weight 132 lb 9.6 oz (60.1 kg), SpO2 98 %.  General: Alert, interactive, in no acute distress. HEENT: TMs pearly gray, turbinates moderately edematous with thick discharge, post-pharynx moderately erythematous. Neck: Supple without lymphadenopathy. Lungs: Clear to auscultation without wheezing, rhonchi or rales. CV: Normal S1, S2 without murmurs. Abdomen: Nondistended, nontender. Skin: Warm and dry, without lesions or rashes. Extremities:  No clubbing, cyanosis or edema. Neuro:   Grossly intact.  Review of systems:  Review of systems negative except as noted in HPI / PMHx or noted below: Review of Systems  Constitutional: Negative.   HENT: Negative.   Eyes: Negative.    Respiratory: Negative.   Cardiovascular: Negative.   Gastrointestinal: Negative.   Genitourinary: Negative.   Musculoskeletal: Negative.   Skin: Negative.   Neurological: Negative.   Endo/Heme/Allergies: Negative.   Psychiatric/Behavioral: Negative.     Past medical history:  Past Medical History:  Diagnosis Date  . Anemia   . Anxiety   . Asthma   . Bronchitis   . Depression   . Headache   . Pneumonia   . Sinusitis     Past surgical history:  Past Surgical History:  Procedure Laterality Date  . ABDOMINOPLASTY  2019  . APPENDECTOMY    . BREAST ENHANCEMENT SURGERY  2019   breast revision 2020,2021  . CESAREAN SECTION N/A 12/03/2015   Procedure: PRIMARY  CESAREAN SECTION;  Surgeon: 12/05/2015, MD;  Location: [facility] BIRTHING SUITES;  Service: Obstetrics;  Laterality: N/A;  Needs RNFA    Family history: Family History  Problem Relation Age of Onset  . Hypertension Mother   . Diabetes Father   . Heart disease Father   . Hypertension Father   . Cancer Father   . Cancer Paternal Uncle   . Heart disease Maternal Grandfather   . Stroke Paternal Grandfather   . Heart disease Maternal Grandmother   . Heart disease Paternal Grandmother   . Allergic rhinitis Daughter   . Allergic rhinitis Son   . Food  Allergy Son   . Angioedema Neg Hx   . Asthma Neg Hx   . Eczema Neg Hx   . Immunodeficiency Neg Hx   . Urticaria Neg Hx     Social history: Social History   Socioeconomic History  . Marital status: Legally Separated    Spouse name: Not on file  . Number of children: Not on file  . Years of education: Not on file  . Highest education level: Not on file  Occupational History  . Not on file  Tobacco Use  . Smoking status: Former Smoker    Types: Cigarettes    Quit date: 04/08/2015    Years since quitting: 4.7  . Smokeless tobacco: Never Used  Vaping Use  . Vaping Use: Every day  . Start date: 09/22/2017  . Devices: juul  Substance and Sexual Activity  . Alcohol  use: No  . Drug use: No  . Sexual activity: Yes    Birth control/protection: None  Other Topics Concern  . Not on file  Social History Narrative  . Not on file   Social Determinants of Health   Financial Resource Strain:   . Difficulty of Paying Living Expenses: Not on file  Food Insecurity:   . Worried About Programme researcher, broadcasting/film/videounning Out of Food in the Last Year: Not on file  . Ran Out of Food in the Last Year: Not on file  Transportation Needs:   . Lack of Transportation (Medical): Not on file  . Lack of Transportation (Non-Medical): Not on file  Physical Activity:   . Days of Exercise per Week: Not on file  . Minutes of Exercise per Session: Not on file  Stress:   . Feeling of Stress : Not on file  Social Connections:   . Frequency of Communication with Friends and Family: Not on file  . Frequency of Social Gatherings with Friends and Family: Not on file  . Attends Religious Services: Not on file  . Active Member of Clubs or Organizations: Not on file  . Attends BankerClub or Organization Meetings: Not on file  . Marital Status: Not on file  Intimate Partner Violence:   . Fear of Current or Ex-Partner: Not on file  . Emotionally Abused: Not on file  . Physically Abused: Not on file  . Sexually Abused: Not on file    Environmental History: The patient lives in a 35 year old house with hardwood floors throughout, gas heat, and central air.  There is no known mold/water damage in the home.  There is a dog in the home which does not have access to her bedroom.  She had previously smoked cigarettes for 17 years and over the past 2 years has been vaping.  Current Outpatient Medications  Medication Sig Dispense Refill  . acetaminophen (TYLENOL) 325 MG tablet Tylenol 325 mg tablet  Take 2 tablets as needed by oral route.    . cetirizine (ZYRTEC) 10 MG tablet cetirizine 10 mg tablet  TK 1 T PO D    . albuterol (VENTOLIN HFA) 108 (90 Base) MCG/ACT inhaler Ventolin HFA 90 mcg/actuation aerosol  inhaler  USE 2 PUFFS Q 6 HOURS PRF WHEEZING (Patient not taking: Reported on 12/24/2019)    . Azelastine HCl 0.15 % SOLN Use 1-2 sprays per nostril twice daily as needed 30 mL 5  . EPINEPHrine (AUVI-Q) 0.3 mg/0.3 mL IJ SOAJ injection Inject 0.3 mg into the muscle as needed for anaphylaxis. 2 each 1  . IRON PO Take 1 tablet by mouth daily. (  Patient not taking: Reported on 12/24/2019)    . levocetirizine (XYZAL) 5 MG tablet Take 1 tablet (5 mg total) by mouth daily as needed. 30 tablet 5  . Olopatadine HCl (PATADAY) 0.2 % SOLN Place 1 drop into both eyes daily as needed. 2.5 mL 5   No current facility-administered medications for this visit.    Known medication allergies: Allergies  Allergen Reactions  . Latex Rash  . Codeine Nausea And Vomiting  . Hydrocodone Nausea And Vomiting  . Other     All tree nuts     I appreciate the opportunity to take part in Unice's care. Please do not hesitate to contact me with questions.  Sincerely,   R. Jorene Guest, MD

## 2020-04-14 ENCOUNTER — Ambulatory Visit: Payer: 59 | Admitting: Family Medicine

## 2020-04-22 ENCOUNTER — Other Ambulatory Visit: Payer: Self-pay

## 2020-04-22 ENCOUNTER — Encounter (HOSPITAL_BASED_OUTPATIENT_CLINIC_OR_DEPARTMENT_OTHER): Payer: Self-pay | Admitting: *Deleted

## 2020-04-22 ENCOUNTER — Emergency Department (HOSPITAL_BASED_OUTPATIENT_CLINIC_OR_DEPARTMENT_OTHER): Payer: 59

## 2020-04-22 ENCOUNTER — Emergency Department (HOSPITAL_BASED_OUTPATIENT_CLINIC_OR_DEPARTMENT_OTHER)
Admission: EM | Admit: 2020-04-22 | Discharge: 2020-04-22 | Disposition: A | Discharge status: Home / Self Care | Payer: 59 | Attending: Emergency Medicine | Admitting: Emergency Medicine

## 2020-04-22 DIAGNOSIS — Z91040 Latex allergy status: Secondary | ICD-10-CM | POA: Diagnosis not present

## 2020-04-22 DIAGNOSIS — J45909 Unspecified asthma, uncomplicated: Secondary | ICD-10-CM | POA: Insufficient documentation

## 2020-04-22 DIAGNOSIS — R1011 Right upper quadrant pain: Secondary | ICD-10-CM | POA: Insufficient documentation

## 2020-04-22 DIAGNOSIS — Z7952 Long term (current) use of systemic steroids: Secondary | ICD-10-CM | POA: Insufficient documentation

## 2020-04-22 DIAGNOSIS — R110 Nausea: Secondary | ICD-10-CM | POA: Insufficient documentation

## 2020-04-22 DIAGNOSIS — Z87891 Personal history of nicotine dependence: Secondary | ICD-10-CM | POA: Diagnosis not present

## 2020-04-22 DIAGNOSIS — Z9089 Acquired absence of other organs: Secondary | ICD-10-CM | POA: Diagnosis not present

## 2020-04-22 DIAGNOSIS — M549 Dorsalgia, unspecified: Secondary | ICD-10-CM | POA: Diagnosis not present

## 2020-04-22 LAB — URINALYSIS, ROUTINE W REFLEX MICROSCOPIC
Bilirubin Urine: NEGATIVE
Glucose, UA: NEGATIVE mg/dL
Hgb urine dipstick: NEGATIVE
Ketones, ur: NEGATIVE mg/dL
Leukocytes,Ua: NEGATIVE
Nitrite: NEGATIVE
Protein, ur: NEGATIVE mg/dL
Specific Gravity, Urine: 1.01 (ref 1.005–1.030)
pH: 6.5 (ref 5.0–8.0)

## 2020-04-22 LAB — CBC WITH DIFFERENTIAL/PLATELET
Abs Immature Granulocytes: 0.01 10*3/uL (ref 0.00–0.07)
Basophils Absolute: 0.1 10*3/uL (ref 0.0–0.1)
Basophils Relative: 1 %
Eosinophils Absolute: 0.1 10*3/uL (ref 0.0–0.5)
Eosinophils Relative: 1 %
HCT: 41.3 % (ref 36.0–46.0)
Hemoglobin: 13.8 g/dL (ref 12.0–15.0)
Immature Granulocytes: 0 %
Lymphocytes Relative: 31 %
Lymphs Abs: 2.7 10*3/uL (ref 0.7–4.0)
MCH: 31.2 pg (ref 26.0–34.0)
MCHC: 33.4 g/dL (ref 30.0–36.0)
MCV: 93.2 fL (ref 80.0–100.0)
Monocytes Absolute: 0.6 10*3/uL (ref 0.1–1.0)
Monocytes Relative: 7 %
Neutro Abs: 5.3 10*3/uL (ref 1.7–7.7)
Neutrophils Relative %: 60 %
Platelets: 285 10*3/uL (ref 150–400)
RBC: 4.43 MIL/uL (ref 3.87–5.11)
RDW: 13.5 % (ref 11.5–15.5)
WBC: 8.8 10*3/uL (ref 4.0–10.5)
nRBC: 0 % (ref 0.0–0.2)

## 2020-04-22 LAB — LIPASE, BLOOD: Lipase: 38 U/L (ref 11–51)

## 2020-04-22 LAB — COMPREHENSIVE METABOLIC PANEL
ALT: 16 U/L (ref 0–44)
AST: 25 U/L (ref 15–41)
Albumin: 4.7 g/dL (ref 3.5–5.0)
Alkaline Phosphatase: 40 U/L (ref 38–126)
Anion gap: 10 (ref 5–15)
BUN: 8 mg/dL (ref 6–20)
CO2: 26 mmol/L (ref 22–32)
Calcium: 9.3 mg/dL (ref 8.9–10.3)
Chloride: 101 mmol/L (ref 98–111)
Creatinine, Ser: 0.69 mg/dL (ref 0.44–1.00)
GFR, Estimated: >60 mL/min (ref >60)
Glucose, Bld: 77 mg/dL (ref 70–99)
Potassium: 3.6 mmol/L (ref 3.5–5.1)
Sodium: 137 mmol/L (ref 135–145)
Total Bilirubin: 0.7 mg/dL (ref 0.3–1.2)
Total Protein: 7.4 g/dL (ref 6.5–8.1)

## 2020-04-22 LAB — PREGNANCY, URINE: Preg Test, Ur: NEGATIVE

## 2020-04-22 MED ORDER — ONDANSETRON HCL 4 MG PO TABS: 4.0000 mg | ORAL_TABLET | Freq: Three times a day (TID) | ORAL | 0 refills | Status: AC | PRN

## 2020-04-22 MED ORDER — DICYCLOMINE HCL 20 MG PO TABS: 20.0000 mg | ORAL_TABLET | Freq: Two times a day (BID) | ORAL | 0 refills | Status: AC

## 2020-04-22 NOTE — ED Triage Notes (Signed)
Mid abdominal pain shooting into her back. Sudden onset. Pain has improved without intervention.

## 2020-04-22 NOTE — ED Notes (Signed)
ED Provider at bedside. 

## 2020-04-22 NOTE — ED Provider Notes (Signed)
[facility] EMERGENCY DEPARTMENT Provider Note   CSN: [id number] Arrival date & time: 04/22/20  1810     History Chief Complaint  Patient presents with  . Abdominal Pain  . Back Pain    Madison [name] is a 35 y.o. female who presents with severe pain. It came on suddenly while giving her child a bath. The patient rates that pain as severe.located in the epigastrium and upper quadrants, It radiates to the back. She had associated nausea without vomiting. Denies diarrhea. Hx of abdominoplasty and appendectomy. She has never had anything like this before pain is worse with jarring of the abdomen, laughing or coughing. She denies fever. She did not take anything for the pain . She denies urinary sxs.   HPI     Past Medical History:  Diagnosis Date  . Anemia   . Anxiety   . Asthma   . Bronchitis   . Depression   . Headache   . Pneumonia   . Sinusitis     Patient Active Problem List   Diagnosis Date Noted  . Perennial and seasonal allergic rhinitis 12/24/2019  . Allergic conjunctivitis 12/24/2019  . Food allergy 12/24/2019  . Latex allergy 12/24/2019  . Pregnancy 12/03/2015  . Cesarean delivery delivered 12/03/2015    Past Surgical History:  Procedure Laterality Date  . ABDOMINOPLASTY  2019  . APPENDECTOMY    . BREAST ENHANCEMENT SURGERY  2019   breast revision 2020,2021  . CESAREAN SECTION N/A 12/03/2015   Procedure: PRIMARY  CESAREAN SECTION;  Surgeon: Candice Camp, MD;  Location: [facility] BIRTHING SUITES;  Service: Obstetrics;  Laterality: N/A;  Needs RNFA     OB History    Gravida  2   Para  2   Term  2   Preterm      AB      Living  2     SAB      IAB      Ectopic      Multiple  0   Live Births  2           Family History  Problem Relation Age of Onset  . Hypertension Mother   . Diabetes Father   . Heart disease Father   . Hypertension Father   . Cancer Father   . Cancer Paternal Uncle   . Heart disease Maternal Grandfather   .  Stroke Paternal Grandfather   . Heart disease Maternal Grandmother   . Heart disease Paternal Grandmother   . Allergic rhinitis Daughter   . Allergic rhinitis Son   . Food Allergy Son   . Angioedema Neg Hx   . Asthma Neg Hx   . Eczema Neg Hx   . Immunodeficiency Neg Hx   . Urticaria Neg Hx     Social History   Tobacco Use  . Smoking status: Former Smoker    Types: Cigarettes    Quit date: 04/08/2015    Years since quitting: 5.0  . Smokeless tobacco: Never Used  Vaping Use  . Vaping Use: Every day  . Start date: 09/22/2017  . Devices: juul  Substance Use Topics  . Alcohol use: No  . Drug use: No    Home Medications Prior to Admission medications   Medication Sig Start Date End Date Taking? Authorizing Provider  acetaminophen (TYLENOL) 325 MG tablet Tylenol 325 mg tablet  Take 2 tablets as needed by oral route.    [provider]  albuterol (VENTOLIN HFA) 108 (90  Base) MCG/ACT inhaler Ventolin HFA 90 mcg/actuation aerosol inhaler  USE 2 PUFFS Q 6 HOURS PRF WHEEZING Patient not taking: Reported on 12/24/2019 07/27/16   [provider]  Azelastine HCl 0.15 % SOLN Use 1-2 sprays per nostril twice daily as needed 12/24/19   Bobbitt, Heywood Iles, MD  cetirizine (ZYRTEC) 10 MG tablet cetirizine 10 mg tablet  TK 1 T PO D    [provider]  EPINEPHrine (AUVI-Q) 0.3 mg/0.3 mL IJ SOAJ injection Inject 0.3 mg into the muscle as needed for anaphylaxis. 12/24/19   Bobbitt, Heywood Iles, MD  IRON PO Take 1 tablet by mouth daily. Patient not taking: Reported on 12/24/2019    [provider]  levocetirizine (XYZAL) 5 MG tablet Take 1 tablet (5 mg total) by mouth daily as needed. 12/24/19   Bobbitt, Heywood Iles, MD  Olopatadine HCl (PATADAY) 0.2 % SOLN Place 1 drop into both eyes daily as needed. 12/24/19   Bobbitt, Heywood Iles, MD    Allergies    Latex, Codeine, Hydrocodone, and Other  Review of Systems   Review of Systems Ten systems reviewed and are  negative for acute change, except as noted in the HPI.   Physical Exam Updated Vital Signs BP (!) 138/97 (BP Location: Right Arm)   Pulse 86   Temp 99 F (37.2 C) (Oral)   Resp 20   Ht 5\' 5"  (1.651 m)   Wt 66 kg   SpO2 100%   BMI 24.23 kg/m   Physical Exam Vitals and nursing note reviewed.  Constitutional:      General: She is not in acute distress.    Appearance: She is well-developed and well-nourished. She is not diaphoretic.  HENT:     Head: Normocephalic and atraumatic.  Eyes:     General: No scleral icterus.    Conjunctiva/sclera: Conjunctivae normal.  Cardiovascular:     Rate and Rhythm: Normal rate and regular rhythm.     Heart sounds: Normal heart sounds. No murmur heard. No friction rub. No gallop.   Pulmonary:     Effort: Pulmonary effort is normal. No respiratory distress.     Breath sounds: Normal breath sounds.  Abdominal:     General: Bowel sounds are normal. There is no distension.     Palpations: Abdomen is soft. There is no mass.     Tenderness: There is abdominal tenderness in the epigastric area. There is no guarding.  Musculoskeletal:     Cervical back: Normal range of motion.  Skin:    General: Skin is warm and dry.  Neurological:     Mental Status: She is alert and oriented to person, place, and time.  Psychiatric:        Behavior: Behavior normal.     ED Results / Procedures / Treatments   Labs (all labs ordered are listed, but only abnormal results are displayed) Labs Reviewed  URINALYSIS, ROUTINE W REFLEX MICROSCOPIC  PREGNANCY, URINE    EKG None  Radiology ABDOMEN LIMITED RUQ (LIVER/GB)  Result Date: 04/22/2020 CLINICAL DATA:  Right upper quadrant pain EXAM: ULTRASOUND ABDOMEN LIMITED RIGHT UPPER QUADRANT COMPARISON:  01/24/2020 FINDINGS: Gallbladder: No gallstones or wall thickening visualized. No sonographic Murphy sign noted by sonographer. Common bile duct: Diameter: 4 mm Liver: No focal lesion identified. Within normal  limits in parenchymal echogenicity. Portal vein is patent on color Doppler imaging with normal direction of blood flow towards the liver. Other: None. IMPRESSION: 1. Unremarkable right upper quadrant ultrasound. Electronically Signed  By: Sharlet Salina M.D.   On: 04/22/2020 20:15    Procedures Procedures   Medications Ordered in ED Medications - No data to display  ED Course  I have reviewed the triage vital signs and the nursing notes.  Pertinent labs & imaging results that were available during my care of the patient were reviewed by me and considered in my medical decision making (see chart for details).    MDM Rules/Calculators/A&P                          UE:[id number] pain VS:  Vitals:   04/22/20 1821 04/22/20 1821 04/22/20 2000 04/22/20 2117  BP:  (!) 138/97 (!) 134/94 112/85  Pulse:  86 63 87  Resp:  20 13 16   Temp:  99 F (37.2 C)  98.2 F (36.8 C)  TempSrc:  Oral  Oral  SpO2:  100% 100% 99%  Weight: 66 kg     Height: 5\' 5"  (1.651 m)       is gathered by patient and emr. Previous records obtained and reviewed. DDX:The patient's complaint of epigastric pain involves an extensive number of diagnostic and treatment options, and is a complaint that carries with it a high risk of complications, morbidity, and potential mortality. Given the large differential diagnosis, medical decision making is of high complexity. Differential diagnosis of epigastric pain includes: Functional or nonulcer dyspepsia (MCC), PUD, GERD, Gastritis, (NSAIDs, alcohol, stress, H. pylori, pernicious anemia), pancreatitis or pancreatic cancer, overeating indigestion (high-fat foods, coffee), drugs (aspirin, antibiotics (eg, macrolides, metronidazole), corticosteroids, digoxin, narcotics, theophylline), gastroparesis, lactose intolerance, malabsorption gastric cancer, parasitic infection, (Giardia, Strongyloides, Ascaris) cholelithiasis, choledocholithiasis, or cholangitis, ACS,  pericarditis, pneumonia, abdominal hernia, pregnancy, intestinal ischemia, esophageal rupture, gastric volvulus, hepatitis. Labs: I ordered reviewed and interpreted labs which include CBC without abnormality, lipase and CMP within normal limits.  Negative urine pregnancy test, UA without abnormality. Imaging: I ordered and reviewed images which included right upper quadrant abdominal ultrasound. I independently visualized and interpreted all imagingThere are no acute, significant findings on today's images. EKG: Consults: MDM: Patient here with about 20 minutes of severe acute epigastric pain radiating to her back which has resolved and is now minimal.  Patient has no active vomiting.  She is hemodynamically stable without fever.  In shared decision making and because of her current lab values and hemodynamic stability will not proceed with any further imaging.  Patient will be discharged with Bentyl, Zofran and close outpatient follow-up.  Discussed return precautions.  Patient appears appropriate for discharge at this time Patient disposition:The patient appears reasonably screened and/or stabilized for discharge and I doubt any other medical condition or other [facility] requiring further screening, evaluation, or treatment in the ED at this time prior to discharge. I have discussed lab and/or imaging findings with the patient and answered all questions/concerns to the best of my ability.I have discussed return precautions and OP follow up.    Final Clinical Impression(s) / ED Diagnoses Final diagnoses:  None    Rx / DC Orders ED Discharge Orders    None       YN:[id number], PA-C 04/22/20 2135    06/22/20, MD 04/22/20 [phone]

## 2020-04-22 NOTE — Discharge Instructions (Signed)

## 2020-04-22 NOTE — ED Notes (Signed)
Pt. Reports she had a severe excruciating pain in her mid to lower abd. Today and the pain went thru to her back she had earlier eaten a grilled chicken salad from [employer] with lite Rasberry [email or web address]  Pt. Reports the pain is around to the back in a ribbon style and some nausea noted.

## 2020-05-27 ENCOUNTER — Telehealth: Payer: Self-pay

## 2020-05-27 NOTE — Telephone Encounter (Signed)
NOTES ON FILE FROM [facility] [phone], SENT REFERRAL TO SCHEDULING 

## 2020-06-06 ENCOUNTER — Telehealth: Payer: Self-pay

## 2020-06-06 NOTE — Telephone Encounter (Signed)
NOTES ON FILE FROM [facility] [place] [phone] SENT REFERRAL TO SCHEDULING

## 2021-10-23 IMAGING — MG MM BREAST LOCALIZATION CLIP
4 series · 4 of 12 positions shown · non-contrast
Comparison: Previous exam(s).

CLINICAL DATA: Status post ultrasound-guided core biopsy of mass in
the 4 o'clock location of the LEFT breast.

EXAM:
DIAGNOSTIC LEFT MAMMOGRAM POST ULTRASOUND BIOPSY

[L ML synth-2D]
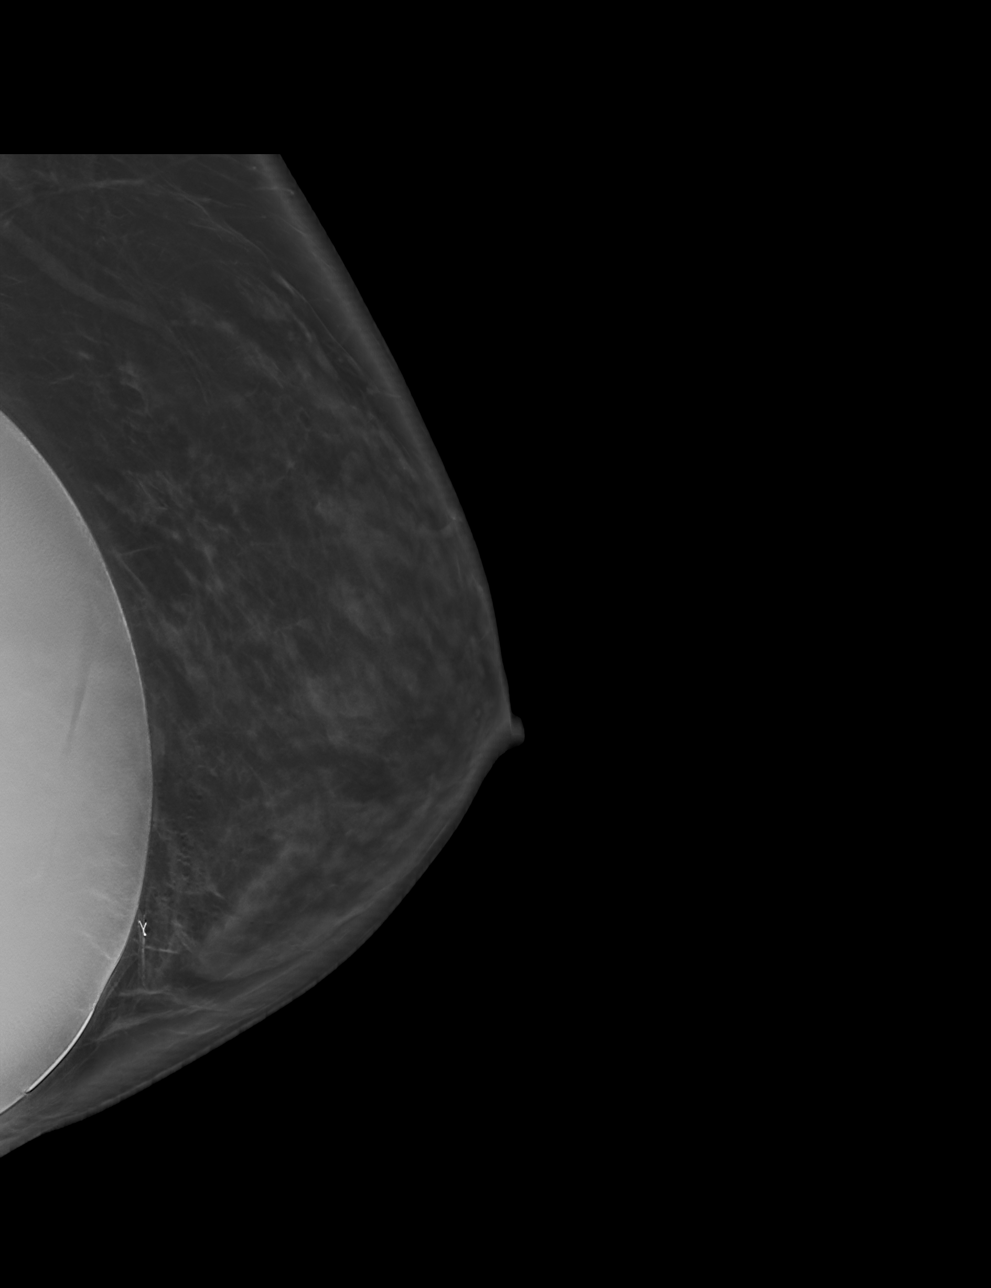

[L CC synth-2D]
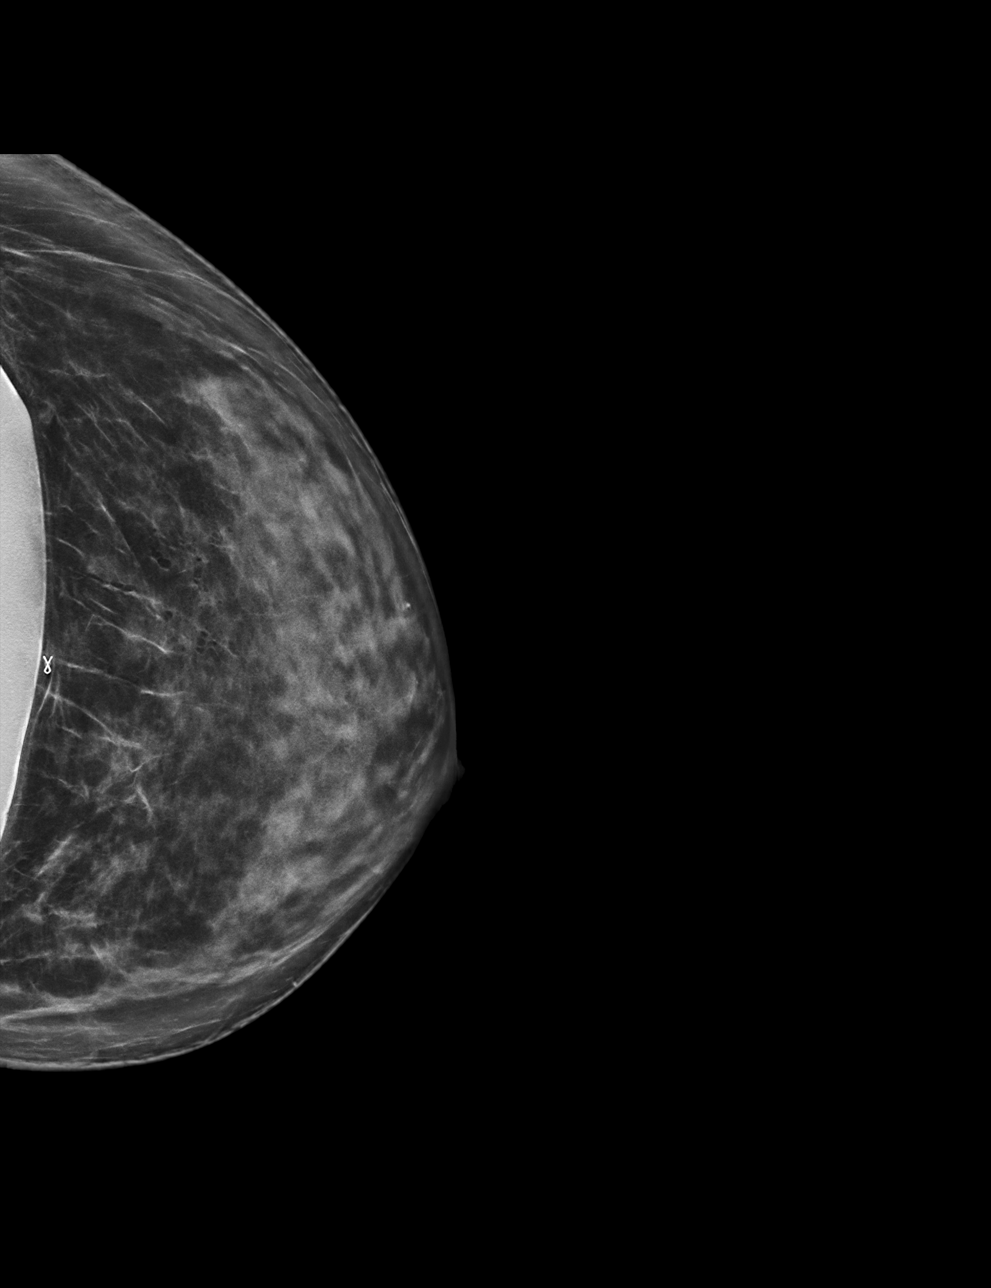

[L ML tomo · tomo slice 37/73.0]
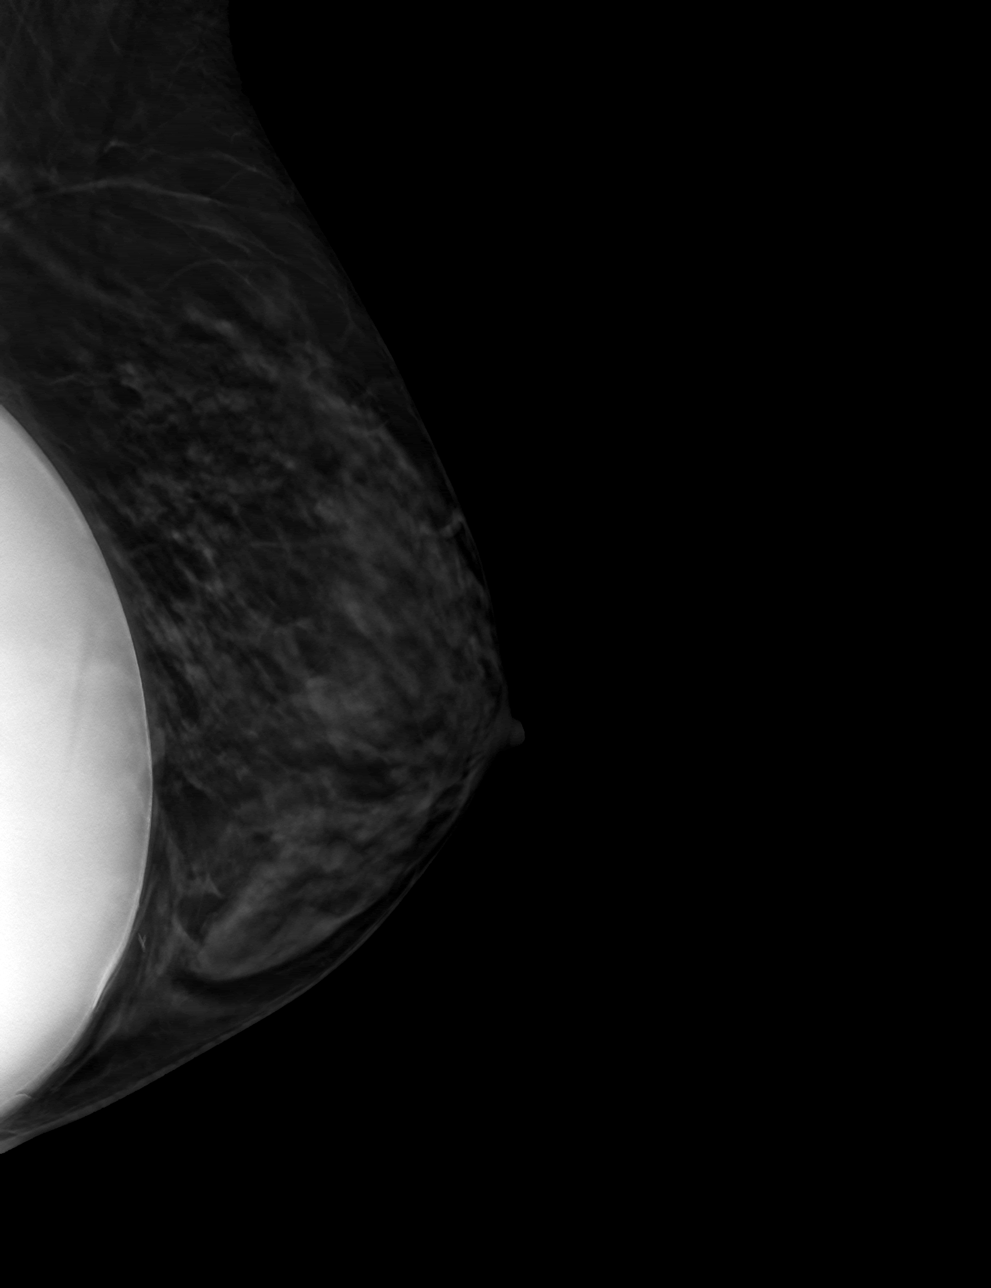

[L CC tomo · tomo slice 35/70.0]
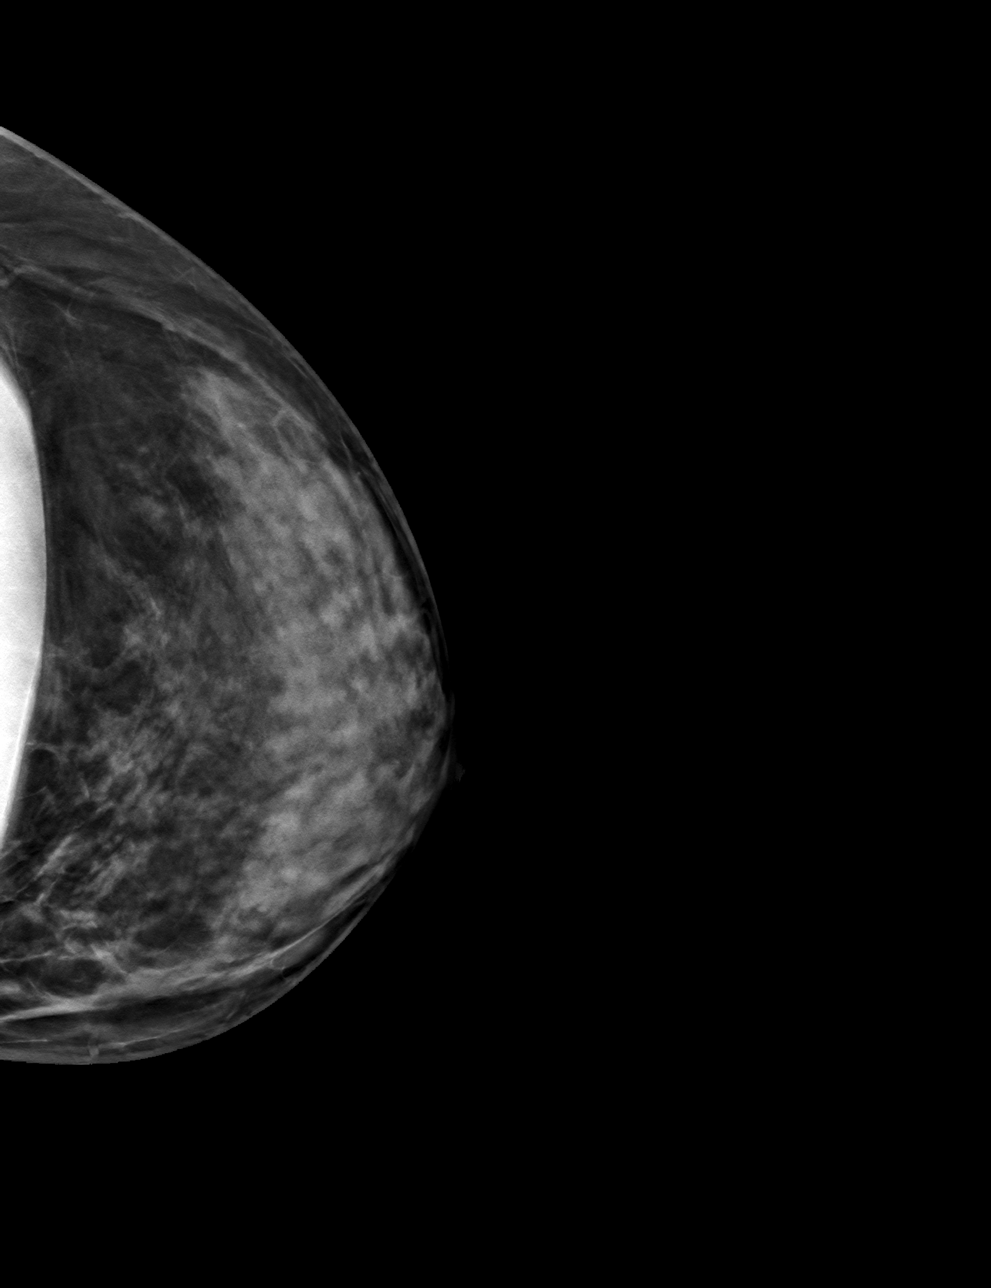

[4 of 12 positions shown; findings below may reference images not displayed]

FINDINGS: Mammographic images were obtained following ultrasound guided biopsy
of mass in the 4 o'clock location of the LEFT breast and placement
of a ribbon shaped clip. The biopsy marking clip is in expected
position at the site of biopsy, adjacent to the implant.
IMPRESSION: Appropriate positioning of the ribbon shaped biopsy marking clip at
the site of biopsy in the LOWER OUTER QUADRANT LEFT breast.

Final Assessment: Post Procedure Mammograms for Marker Placement

## 2022-07-13 ENCOUNTER — Emergency Department (HOSPITAL_BASED_OUTPATIENT_CLINIC_OR_DEPARTMENT_OTHER): Payer: 59

## 2022-07-13 ENCOUNTER — Other Ambulatory Visit: Payer: Self-pay

## 2022-07-13 ENCOUNTER — Encounter (HOSPITAL_BASED_OUTPATIENT_CLINIC_OR_DEPARTMENT_OTHER): Payer: Self-pay | Admitting: Emergency Medicine

## 2022-07-13 ENCOUNTER — Emergency Department (HOSPITAL_BASED_OUTPATIENT_CLINIC_OR_DEPARTMENT_OTHER)
Admission: EM | Admit: 2022-07-13 | Discharge: 2022-07-13 | Disposition: A | Discharge status: Home / Self Care | Payer: 59 | Attending: Emergency Medicine | Admitting: Emergency Medicine

## 2022-07-13 DIAGNOSIS — R55 Syncope and collapse: Secondary | ICD-10-CM | POA: Insufficient documentation

## 2022-07-13 LAB — BASIC METABOLIC PANEL
Anion gap: 11 (ref 5–15)
BUN: 7 mg/dL (ref 6–20)
CO2: 24 mmol/L (ref 22–32)
Calcium: 9.6 mg/dL (ref 8.9–10.3)
Chloride: 101 mmol/L (ref 98–111)
Creatinine, Ser: 0.62 mg/dL (ref 0.44–1.00)
GFR, Estimated: >60 mL/min (ref >60)
Glucose, Bld: 94 mg/dL (ref 70–99)
Potassium: 3.7 mmol/L (ref 3.5–5.1)
Sodium: 136 mmol/L (ref 135–145)

## 2022-07-13 LAB — URINALYSIS, MICROSCOPIC (REFLEX): WBC, UA: NONE SEEN WBC/hpf (ref 0–5)

## 2022-07-13 LAB — CBC
HCT: 40.8 % (ref 36.0–46.0)
Hemoglobin: 13.8 g/dL (ref 12.0–15.0)
MCH: 31.3 pg (ref 26.0–34.0)
MCHC: 33.8 g/dL (ref 30.0–36.0)
MCV: 92.5 fL (ref 80.0–100.0)
Platelets: 246 10*3/uL (ref 150–400)
RBC: 4.41 MIL/uL (ref 3.87–5.11)
RDW: 12.6 % (ref 11.5–15.5)
WBC: 8.7 10*3/uL (ref 4.0–10.5)
nRBC: 0 % (ref 0.0–0.2)

## 2022-07-13 LAB — URINALYSIS, ROUTINE W REFLEX MICROSCOPIC
Bilirubin Urine: NEGATIVE
Glucose, UA: NEGATIVE mg/dL
Ketones, ur: NEGATIVE mg/dL
Leukocytes,Ua: NEGATIVE
Nitrite: NEGATIVE
Protein, ur: NEGATIVE mg/dL
Specific Gravity, Urine: 1.02 (ref 1.005–1.030)
pH: 6.5 (ref 5.0–8.0)

## 2022-07-13 LAB — TROPONIN I (HIGH SENSITIVITY)
Troponin I (High Sensitivity): <2 ng/L (ref <18)
Troponin I (High Sensitivity): <2 ng/L (ref <18)

## 2022-07-13 LAB — PREGNANCY, URINE: Preg Test, Ur: NEGATIVE

## 2022-07-13 LAB — D-DIMER, QUANTITATIVE: D-Dimer, Quant: <0.27 ug/mL-FEU (ref 0.00–0.50)

## 2022-07-13 MED ORDER — IOHEXOL 350 MG/ML SOLN
75.0000 mL | Freq: Once | INTRAVENOUS | Status: AC | PRN
  Administered 2022-07-13: 75 mL via INTRAVENOUS

## 2022-07-13 NOTE — ED Notes (Signed)
Pt started to hyperventilate in triage, became tearful and stated she was feeling dizzy.  Encouraged pt to slow her breathing.  Pt was able to slow her breathing.

## 2022-07-13 NOTE — ED Provider Notes (Signed)
[facility] Provider Note   CSN: [id number] Arrival date & time: 07/13/22  1432     History  Chief Complaint  Patient presents with   Near Syncope    Madison [name] is a 37 y.o. female.  Patient complains of a near syncopal episode.  The history is provided by the patient. No language interpreter was used.  Near Syncope This is a new problem. The problem occurs constantly. The problem has not changed since onset.Nothing aggravates the symptoms. Nothing relieves the symptoms. She has tried nothing for the symptoms. The treatment provided no relief.       Home Medications Prior to Admission medications   Medication Sig Start Date End Date Taking? Authorizing Provider  acetaminophen (TYLENOL) 325 MG tablet Tylenol 325 mg tablet  Take 2 tablets as needed by oral route.    [provider]  albuterol (VENTOLIN HFA) 108 (90 Base) MCG/ACT inhaler Ventolin HFA 90 mcg/actuation aerosol inhaler  USE 2 PUFFS Q 6 HOURS PRF WHEEZING Patient not taking: Reported on 12/24/2019 07/27/16   [provider]  Azelastine HCl 0.15 % SOLN Use 1-2 sprays per nostril twice daily as needed 12/24/19   Bobbitt, Heywood Iles, MD  cetirizine (ZYRTEC) 10 MG tablet cetirizine 10 mg tablet  TK 1 T PO D    [provider]  dicyclomine (BENTYL) 20 MG tablet Take 1 tablet (20 mg total) by mouth 2 (two) times daily. 04/22/20   Arthor Captain, PA-C  EPINEPHrine (AUVI-Q) 0.3 mg/0.3 mL IJ SOAJ injection Inject 0.3 mg into the muscle as needed for anaphylaxis. 12/24/19   Bobbitt, Heywood Iles, MD  IRON PO Take 1 tablet by mouth daily. Patient not taking: Reported on 12/24/2019    [provider]  levocetirizine (XYZAL) 5 MG tablet Take 1 tablet (5 mg total) by mouth daily as needed. 12/24/19   Bobbitt, Heywood Iles, MD  Olopatadine HCl (PATADAY) 0.2 % SOLN Place 1 drop into both eyes daily as needed. 12/24/19   Bobbitt, Heywood Iles, MD   ondansetron (ZOFRAN) 4 MG tablet Take 1 tablet (4 mg total) by mouth every 8 (eight) hours as needed for nausea or vomiting. 04/22/20   Arthor Captain, PA-C      Allergies    Latex, Codeine, Hydrocodone, Metoclopramide, and Other    Review of Systems   Review of Systems  Cardiovascular:  Positive for near-syncope.  All other systems reviewed and are negative.   Physical Exam Updated Vital Signs BP 116/84   Pulse 73   Temp 98.5 F (36.9 C) (Oral)   Resp 11   Ht 5\' 5"  (1.651 m)   Wt 70.3 kg   LMP 07/05/2022   SpO2 98%   BMI 25.79 kg/m  Physical Exam Vitals reviewed.  Constitutional:      Appearance: Normal appearance.  HENT:     Head: Normocephalic and atraumatic.     Nose: Nose normal.     Mouth/Throat:     Mouth: Mucous membranes are moist.  Eyes:     Pupils: Pupils are equal, round, and reactive to light.  Cardiovascular:     Rate and Rhythm: Normal rate and regular rhythm.  Pulmonary:     Effort: Pulmonary effort is normal.  Abdominal:     General: Abdomen is flat.  Musculoskeletal:        General: Normal range of motion.  Skin:    General: Skin is warm.  Neurological:     General: No  focal deficit present.     Mental Status: She is alert.  Psychiatric:        Mood and Affect: Mood normal.     ED Results / Procedures / Treatments   Labs (all labs ordered are listed, but only abnormal results are displayed) Labs Reviewed  URINALYSIS, ROUTINE W REFLEX MICROSCOPIC - Abnormal; Notable for the following components:      Result Value   Hgb urine dipstick SMALL (*)    All other components within normal limits  URINALYSIS, MICROSCOPIC (REFLEX) - Abnormal; Notable for the following components:   Bacteria, UA RARE (*)    All other components within normal limits  BASIC METABOLIC PANEL  CBC  PREGNANCY, URINE  D-DIMER, QUANTITATIVE  TROPONIN I (HIGH SENSITIVITY)  TROPONIN I (HIGH SENSITIVITY)    EKG EKG Interpretation  Date/Time:  Tuesday Jul 13 2022  14:42:07 EDT Ventricular Rate:  89 PR Interval:  174 QRS Duration: 87 QT Interval:  353 QTC Calculation: 430 R Axis:   86 Text Interpretation: Sinus rhythm Low voltage, precordial leads Probable anteroseptal infarct, old Confirmed by Ernie Avena ([id number]) on 07/13/2022 4:32:45 PM  Radiology CT ANGIO HEAD NECK W WO CM  Result Date: 07/13/2022 CLINICAL DATA:  Syncope/presyncope EXAM: CT ANGIOGRAPHY HEAD AND NECK WITH AND WITHOUT CONTRAST TECHNIQUE: Multidetector CT imaging of the head and neck was performed using the standard protocol during bolus administration of intravenous contrast. Multiplanar CT image reconstructions and MIPs were obtained to evaluate the vascular anatomy. Carotid stenosis measurements (when applicable) are obtained utilizing NASCET criteria, using the distal internal carotid diameter as the denominator. RADIATION DOSE REDUCTION: This exam was performed according to the departmental dose-optimization program which includes automated exposure control, adjustment of the mA and/or kV according to patient size and/or use of iterative reconstruction technique. CONTRAST:  75mL OMNIPAQUE IOHEXOL 350 MG/ML SOLN COMPARISON:  Brain MRI 12/29/2020 FINDINGS: CT HEAD FINDINGS Brain: There is no acute intracranial hemorrhage, extra-axial fluid collection, or acute infarct. Parenchymal volume is normal. The ventricles are normal in size. Patchy hypodensity in the supratentorial white matter is nonspecific with differential discussed on prior brain MRI. Apparent hypodensity in the occipital lobes bilaterally is favored artifactual. Gray-white differentiation is preserved. The pituitary and suprasellar region are normal. There is no mass lesion. There is no mass effect or midline shift. Vascular: See below. Skull: Normal. Negative for fracture or focal lesion. Sinuses/Orbits: The paranasal sinuses are clear. The globes and orbits are unremarkable. Other: The mastoid air cells and middle ear cavities  are clear. Review of the MIP images confirms the above findings CTA NECK FINDINGS Aortic arch: The aortic arch is normal. The origins of the major branch vessels are patent. The subclavian arteries are patent to the level imaged. Right carotid system: The right common, internal, and external carotid arteries are patent, without hemodynamically significant stenosis or occlusion there is no evidence of dissection or aneurysm. Left carotid system: The left common, internal, and external carotid arteries are patent, without hemodynamically significant stenosis or occlusion. There is no evidence of dissection or aneurysm. Vertebral arteries: The vertebral arteries are patent, without hemodynamically significant stenosis or occlusion there is no evidence of dissection or aneurysm. Skeleton: There is no acute osseous abnormality or suspicious osseous lesion. There is no visible canal hematoma. Other neck: The soft tissues of the neck are unremarkable. Upper chest: The imaged lung apices are clear. Review of the MIP images confirms the above findings CTA HEAD FINDINGS Anterior circulation: The intracranial ICAs are  patent. The bilateral MCAs and ACAS are normal. The anterior communicating artery is normal. There is no aneurysm or AVM. Posterior circulation: The bilateral V4 segments are normal. The basilar artery is normal. The major cerebellar arteries appear patent. The bilateral PCAs are normal. Posterior communicating arteries are not definitely seen. There is no aneurysm or AVM. Venous sinuses: Patent. Anatomic variants: None. Review of the MIP images confirms the above findings IMPRESSION: 1. No acute intracranial pathology on initial noncontrast head CT. 2. Normal vasculature of the head and neck. Electronically Signed   By: Lesia Hausen M.D.   On: 07/13/2022 18:30   CT Angio Chest PE W and/or Wo Contrast  Result Date: 07/13/2022 CLINICAL DATA:  Recent syncopal episode EXAM: CT ANGIOGRAPHY CHEST WITH CONTRAST  TECHNIQUE: Multidetector CT imaging of the chest was performed using the standard protocol during bolus administration of intravenous contrast. Multiplanar CT image reconstructions and MIPs were obtained to evaluate the vascular anatomy. RADIATION DOSE REDUCTION: This exam was performed according to the departmental dose-optimization program which includes automated exposure control, adjustment of the mA and/or kV according to patient size and/or use of iterative reconstruction technique. CONTRAST:  75mL OMNIPAQUE IOHEXOL 350 MG/ML SOLN COMPARISON:  None Available. FINDINGS: Cardiovascular: Thoracic aorta and its branches are within normal limits. No aneurysmal dilatation or dissection is noted. The pulmonary artery shows a normal branching pattern bilaterally. No filling defect to suggest pulmonary embolism is seen. No cardiac enlargement is noted. No coronary calcifications are seen. Mediastinum/Nodes: Esophagus is within normal limits. No hilar or mediastinal adenopathy is noted. The thoracic inlet shows an 8 mm calcified left thyroid nodule. Lungs/Pleura: Lungs are well aerated bilaterally. No focal infiltrate or sizable effusion is seen. No pneumothorax is noted. Upper Abdomen: Visualized upper abdomen within normal limits. Musculoskeletal: Bilateral breast implants are seen. No acute rib abnormality is noted. No compression deformity is seen. Review of the MIP images confirms the above findings. IMPRESSION: No evidence of pulmonary emboli. Subcentimeter incidental left thyroid nodule. No follow-up imaging is recommended. Reference: J Am Coll Radiol. 2015 Feb;12(2): 143-50 No acute abnormality seen. Electronically Signed   By: Alcide Clever M.D.   On: 07/13/2022 17:49   DG Chest 2 View  Result Date: 07/13/2022 CLINICAL DATA:  Chest pain EXAM: CHEST - 2 VIEW COMPARISON:  10/19/2021 FINDINGS: The heart size and mediastinal contours are within normal limits. Both lungs are clear. The visualized skeletal  structures are unremarkable. IMPRESSION: No active cardiopulmonary disease. Electronically Signed   By: Judie Petit.  Shick M.D.   On: 07/13/2022 15:09    Procedures Procedures    Medications Ordered in ED Medications  iohexol (OMNIPAQUE) 350 MG/ML injection 75 mL (75 mLs Intravenous Contrast Given 07/13/22 1705)  iohexol (OMNIPAQUE) 350 MG/ML injection 75 mL (75 mLs Intravenous Contrast Given 07/13/22 1719)    ED Course/ Medical Decision Making/ A&P                             Medical Decision Making Reports that she was having a bowel movement.  Patient began feeling lightheaded.  Patient reports that she had a pain go from her mid back chest area up the back of the neck and to her head.  Patient reports that she has had a headache since this happened.  Patient reports that she has a history of experiencing lightheadedness with bowel movements.  Patient has a past medical history of a positive ANA and is being evaluated  by rheumatology for lupus.  Patient reports that she has been told that she has some small calcification on an MRI of her brain.  Patient has been seen by neurology.  She was not thought to have MS or have had a stroke.  Patient reports she has been seen by cardiology and was told that she had a small leak.  Patient reports cardiology did not feel that she needed any type of intervention.  Amount and/or Complexity of Data Reviewed Independent Historian: spouse    Details: With her husband who is supportive External Data Reviewed: notes.    Details: Rheumatology notes reviewed Labs: ordered. Decision-making details documented in ED Course.    Details: Labs ordered reviewed and interpreted.  Patient has a negative D-dimer CBC and c-Met are normal Radiology: ordered and independent interpretation performed. Decision-making details documented in ED Course.    Details: CT angio chest CT angio head and neck ordered reviewed and interpreted no acute findings ECG/medicine tests: ordered and  independent interpretation performed. Decision-making details documented in ED Course.    Details: She shows no acute abnormality  Risk OTC drugs. Prescription drug management. Risk Details: Patient is counseled on results.  Patient is advised to follow-up with her primary care physician and with her rheumatologist as scheduled.  Patient is advised to be cautious when standing after using the bathroom.           Final Clinical Impression(s) / ED Diagnoses Final diagnoses:  Near syncope    Rx / DC Orders ED Discharge Orders     None      An After Visit Summary was printed and given to the patient.    Osie Cheeks 07/13/22 Mikle Bosworth    Ernie Avena, MD 07/13/22 [id number]

## 2022-07-13 NOTE — ED Triage Notes (Signed)
Pt states she started to have dizziness this am.  About one hour ago, she was having a BM and started to have chest pain radiating to her back and up her neck.  Pt felt dizzy and like she was going to pass out.  No LOC.  Pt admits to numbness and tingling in all extremities.  No acute distress noted at this time.Marland Kitchen

## 2022-07-16 IMAGING — US US ABDOMEN LIMITED RUQ/ASCITES
1 series · 14 of 25 positions shown · non-contrast
Comparison: 01/24/2020

CLINICAL DATA: Right upper quadrant pain

EXAM:
ULTRASOUND ABDOMEN LIMITED RIGHT UPPER QUADRANT

[Series 1: us abdomen limited ruq/ascites · 14 of 53 slices shown]
[im 1/53]
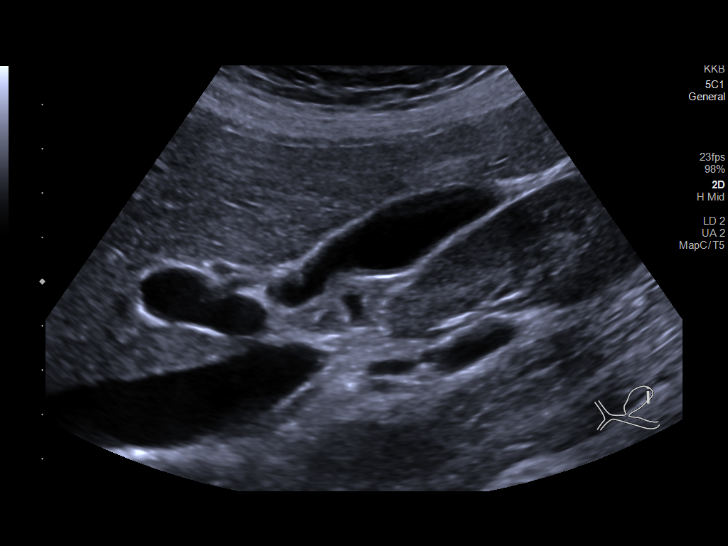
[im 5/53]
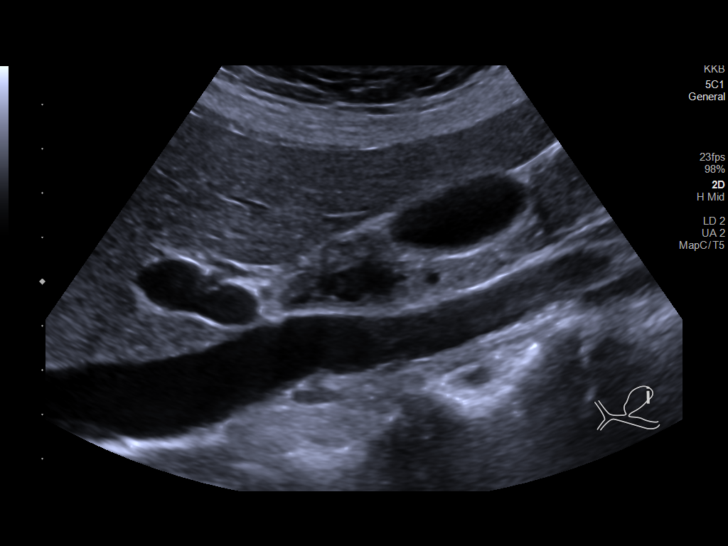
[im 9/53]
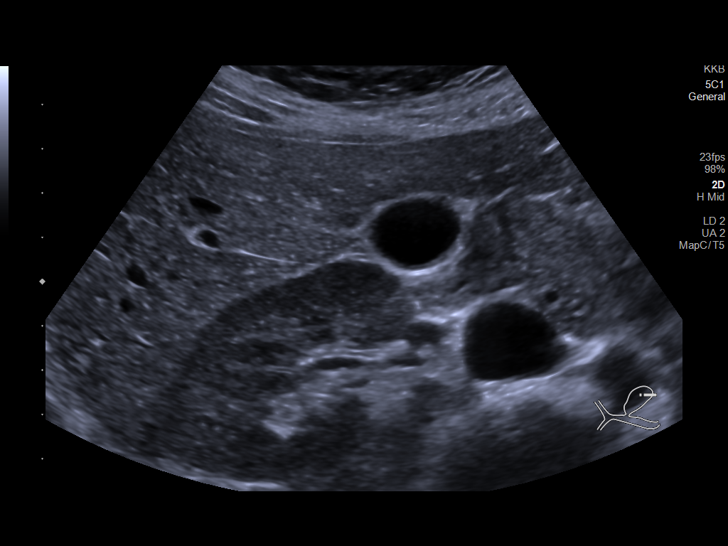
[im 14/53]
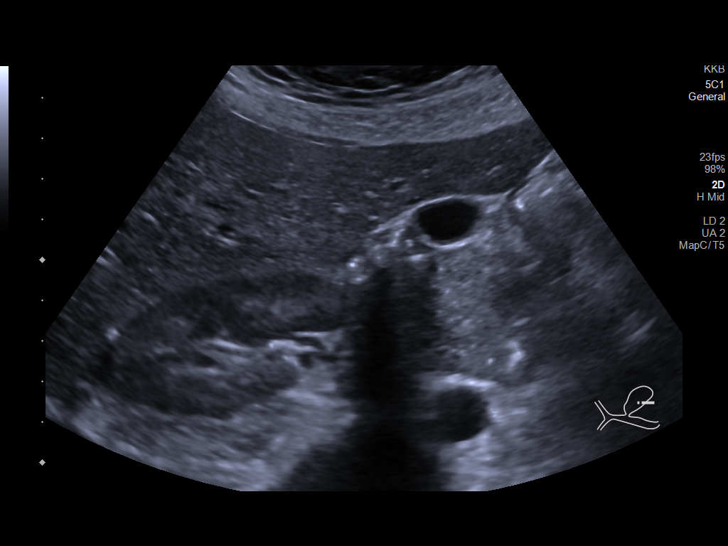
[im 18/53]
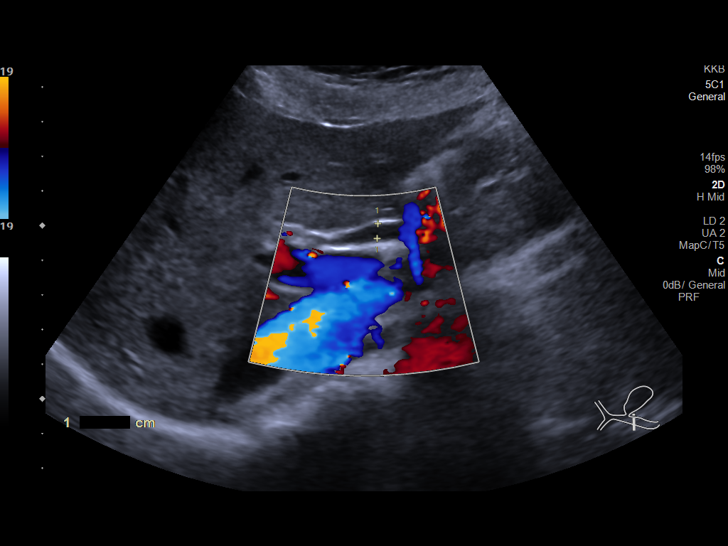
[im 20/53]
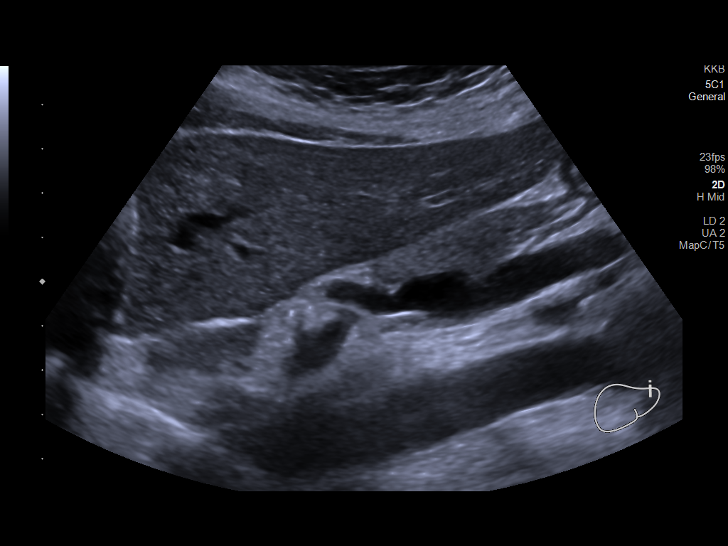
[im 24/53]
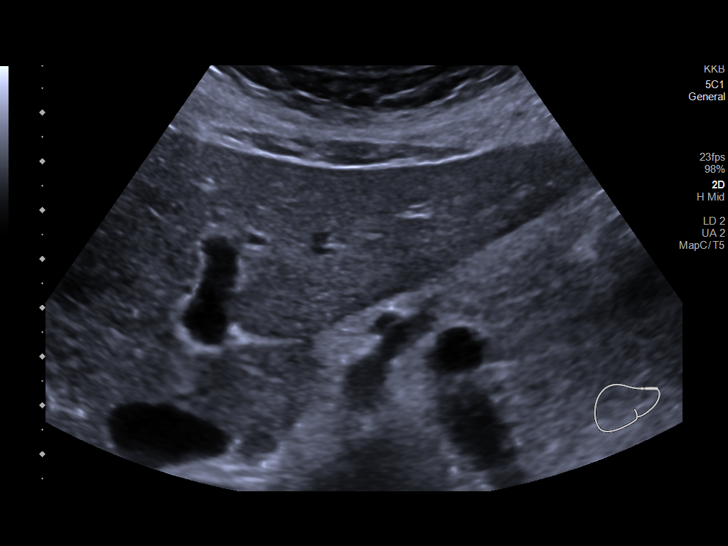
[im 29/53]
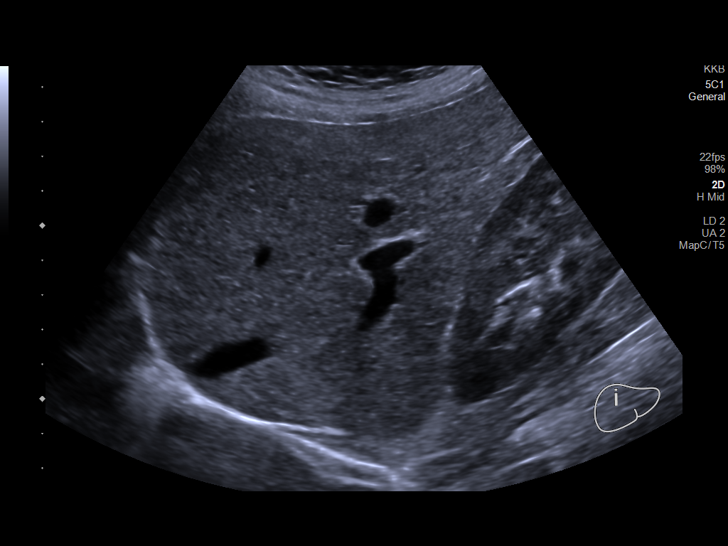
[im 33/53]
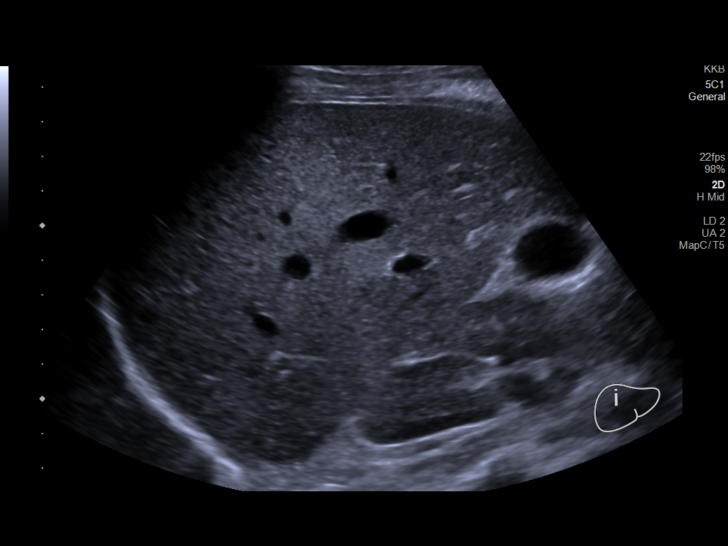
[im 35/53]
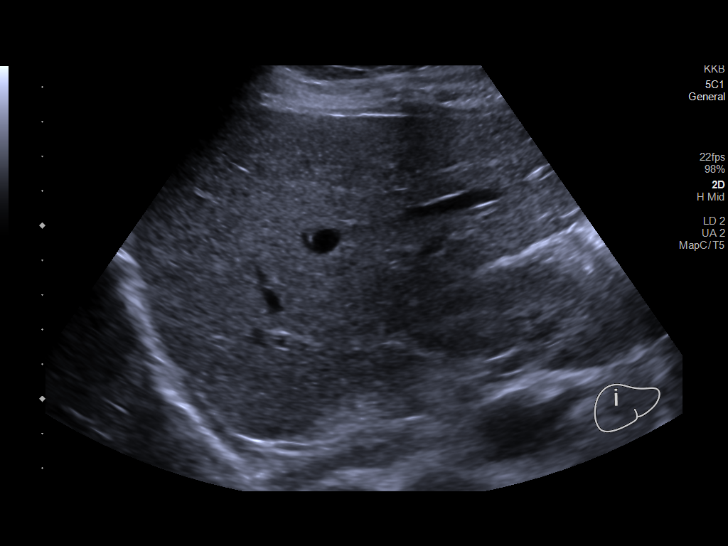
[im 40/53]
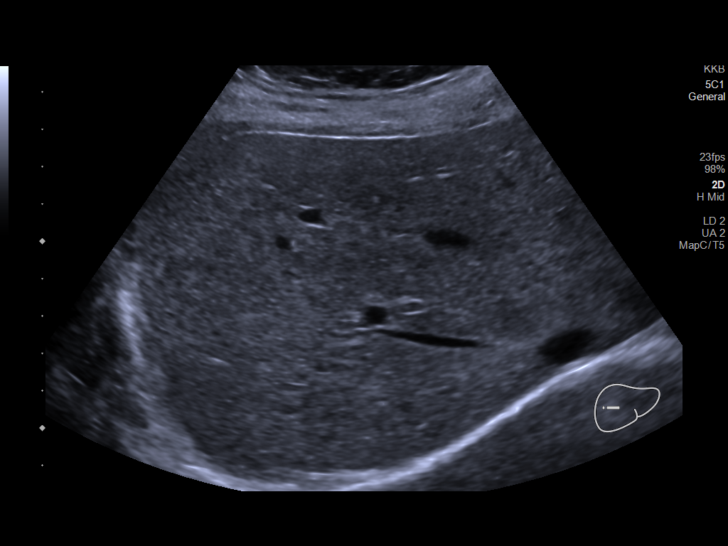
[im 44/53]
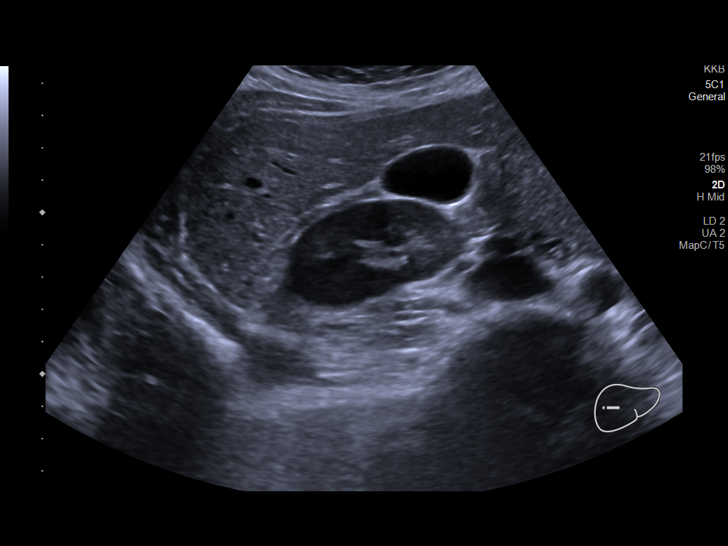
[im 48/53]
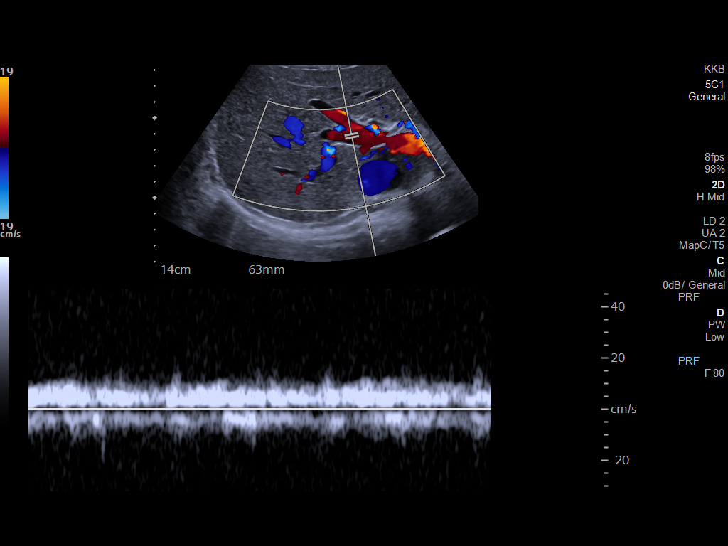
[im 53/53]
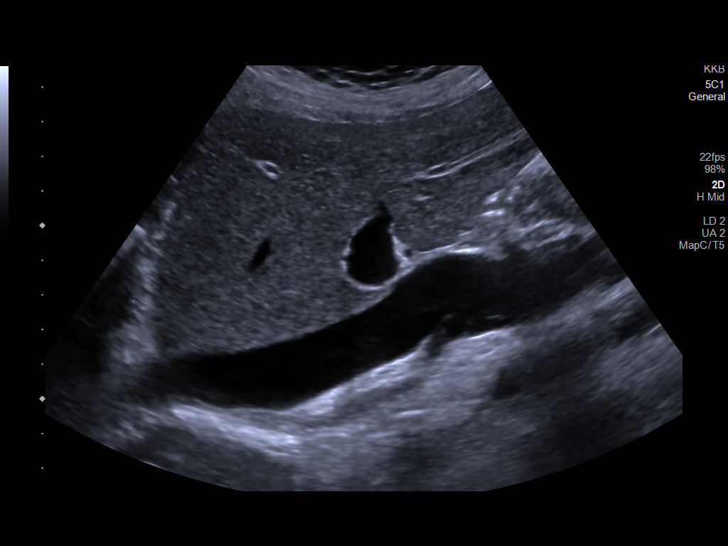

[14 of 25 positions shown; findings below may reference images not displayed]

FINDINGS: Gallbladder:

No gallstones or wall thickening visualized. No sonographic Murphy
sign noted by sonographer.

Common bile duct:

Diameter: 4 mm

Liver:

No focal lesion identified. Within normal limits in parenchymal
echogenicity. Portal vein is patent on color Doppler imaging with
normal direction of blood flow towards the liver.

Other: None.
IMPRESSION: 1. Unremarkable right upper quadrant ultrasound.
